# Patient Record
Sex: Male | Born: 1990 | Race: White | Hispanic: No | Marital: Married | Smoking: Never smoker
Health system: Southern US, Community
[De-identification: ages and names within clinical notes are randomized; demographics above are authoritative.]

## PROBLEM LIST (undated history)

## (undated) DIAGNOSIS — I10 Essential (primary) hypertension: Secondary | ICD-10-CM

## (undated) DIAGNOSIS — R569 Unspecified convulsions: Secondary | ICD-10-CM

## (undated) HISTORY — PX: APPENDECTOMY: SHX54

---
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
## 2010-10-12 ENCOUNTER — Inpatient Hospital Stay (INDEPENDENT_AMBULATORY_CARE_PROVIDER_SITE_OTHER)
Admission: RE | Admit: 2010-10-12 | Discharge: 2010-10-12 | Disposition: A | Discharge status: Home / Self Care | Payer: Self-pay | Source: Ambulatory Visit | Attending: Emergency Medicine | Admitting: Emergency Medicine

## 2010-10-12 DIAGNOSIS — J069 Acute upper respiratory infection, unspecified: Secondary | ICD-10-CM

## 2011-01-16 ENCOUNTER — Encounter: Payer: Self-pay | Admitting: *Deleted

## 2011-01-16 ENCOUNTER — Emergency Department (HOSPITAL_COMMUNITY)
Admission: EM | Admit: 2011-01-16 | Discharge: 2011-01-16 | Disposition: A | Discharge status: Home / Self Care | Payer: Self-pay | Attending: Emergency Medicine | Admitting: Emergency Medicine

## 2011-01-16 DIAGNOSIS — H109 Unspecified conjunctivitis: Secondary | ICD-10-CM | POA: Insufficient documentation

## 2011-01-16 MED ORDER — SULFACETAMIDE SODIUM 10 % OP SOLN
2.0000 [drp] | Freq: Once | OPHTHALMIC | Status: DC
  Filled 2011-01-16: qty 15

## 2011-01-16 NOTE — ED Provider Notes (Signed)
History     Chief Complaint  Patient presents with  . Conjunctivitis   Patient is a 20 y.o. male presenting with conjunctivitis. The history is provided by the patient.  Conjunctivitis  The current episode started 2 days ago. The onset was gradual. The problem has been gradually worsening. The problem is mild. The symptoms are relieved by nothing. The symptoms are aggravated by nothing. Associated symptoms include eye discharge, eye pain and eye redness. Pertinent negatives include no fever, no decreased vision, no photophobia, no ear discharge, no headaches, no rhinorrhea, no sore throat, no cough, no URI and no rash. The eye pain is mild. There is pain in the right eye. The eyelid exhibits redness. He has been behaving normally.    History reviewed. No pertinent past medical history.  Past Surgical History  Procedure Date  . Appendectomy     Family History  Problem Relation Age of Onset  . Hypertension Mother   . Heart failure Mother   . Hypertension Father   . Heart failure Father     History  Substance Use Topics  . Smoking status: Never Smoker   . Smokeless tobacco: Not on file  . Alcohol Use: No      Review of Systems  Constitutional: Negative for fever.  HENT: Negative for sore throat, rhinorrhea and ear discharge.   Eyes: Positive for pain, discharge and redness. Negative for photophobia.  Respiratory: Negative for cough.   Skin: Negative for rash.  Neurological: Negative for headaches.  All other systems reviewed and are negative.    Physical Exam  BP 126/64  Pulse 84  Temp(Src) 98.2 F (36.8 C) (Oral)  Resp 16  Ht 5\' 9"  (1.753 m)  Wt 180 lb (81.647 kg)  BMI 26.58 kg/m2  SpO2 99%  Physical Exam  Vitals reviewed. Constitutional: He is oriented to person, place, and time. He appears well-developed and well-nourished.  HENT:  Head: Normocephalic and atraumatic.  Eyes: Pupils are equal, round, and reactive to light. Right eye exhibits discharge.  Right conjunctiva is injected. Right eye exhibits normal extraocular motion and no nystagmus.  Neck: Normal range of motion.  Cardiovascular: Normal rate, regular rhythm, normal heart sounds and intact distal pulses.   Pulmonary/Chest: Effort normal and breath sounds normal. He has no wheezes.  Abdominal: Soft. Bowel sounds are normal. There is no tenderness.  Musculoskeletal: Normal range of motion.  Neurological: He is alert and oriented to person, place, and time.  Skin: Skin is warm and dry.  Psychiatric: He has a normal mood and affect.    ED Course  Procedures  MDM   Bleph 10 given in ed.      Candis Musa, PA 01/16/11 1717

## 2011-01-16 NOTE — ED Notes (Signed)
Rt eye red and watering x 4 days. States that it is draining at night.

## 2011-02-16 NOTE — ED Provider Notes (Signed)
History     CSN: [id number] Arrival date & time: 01/16/2011  3:43 PM  Chief Complaint  Patient presents with  . Conjunctivitis   HPI  History reviewed. No pertinent past medical history.  Past Surgical History  Procedure Date  . Appendectomy     Family History  Problem Relation Age of Onset  . Hypertension Mother   . Heart failure Mother   . Hypertension Father   . Heart failure Father     History  Substance Use Topics  . Smoking status: Never Smoker   . Smokeless tobacco: Not on file  . Alcohol Use: No      Review of Systems  Physical Exam  BP 126/64  Pulse 84  Temp(Src) 98.2 F (36.8 C) (Oral)  Resp 16  Ht 5\' 9"  (1.753 m)  Wt 180 lb (81.647 kg)  BMI 26.58 kg/m2  SpO2 99%  Physical Exam  ED Course  Procedures  MDM Medical screening examination/treatment/procedure(s) were performed by non-physician practitioner and as supervising physician I was immediately available for consultation/collaboration.      Donnetta Hutching, MD 02/16/11 1327

## 2011-10-01 ENCOUNTER — Emergency Department (HOSPITAL_COMMUNITY)
Admission: EM | Admit: 2011-10-01 | Discharge: 2011-10-01 | Disposition: A | Discharge status: Home / Self Care | Payer: Self-pay | Attending: Emergency Medicine | Admitting: Emergency Medicine

## 2011-10-01 ENCOUNTER — Encounter (HOSPITAL_COMMUNITY): Payer: Self-pay | Admitting: *Deleted

## 2011-10-01 DIAGNOSIS — N4281 Prostatodynia syndrome: Secondary | ICD-10-CM

## 2011-10-01 DIAGNOSIS — N509 Disorder of male genital organs, unspecified: Secondary | ICD-10-CM | POA: Insufficient documentation

## 2011-10-01 DIAGNOSIS — R109 Unspecified abdominal pain: Secondary | ICD-10-CM | POA: Insufficient documentation

## 2011-10-01 DIAGNOSIS — N419 Inflammatory disease of prostate, unspecified: Secondary | ICD-10-CM | POA: Insufficient documentation

## 2011-10-01 DIAGNOSIS — N4289 Other specified disorders of prostate: Secondary | ICD-10-CM | POA: Insufficient documentation

## 2011-10-01 LAB — URINALYSIS, ROUTINE W REFLEX MICROSCOPIC
Bilirubin Urine: NEGATIVE
Glucose, UA: NEGATIVE mg/dL
Ketones, ur: NEGATIVE mg/dL
Leukocytes, UA: NEGATIVE
Nitrite: NEGATIVE
Protein, ur: NEGATIVE mg/dL
Urobilinogen, UA: 0.2 mg/dL (ref 0.0–1.0)
pH: 6 (ref 5.0–8.0)

## 2011-10-01 LAB — URINE MICROSCOPIC-ADD ON

## 2011-10-01 MED ORDER — MINOCYCLINE HCL 100 MG PO CAPS: 100.0000 mg | ORAL_CAPSULE | Freq: Two times a day (BID) | ORAL | Status: AC

## 2011-10-01 MED ORDER — CEFTRIAXONE SODIUM 250 MG IJ SOLR
250.0000 mg | Freq: Once | INTRAMUSCULAR | Status: AC
  Administered 2011-10-01: 250 mg via INTRAMUSCULAR
  Filled 2011-10-01: qty 250

## 2011-10-01 MED ORDER — DOXYCYCLINE HYCLATE 100 MG PO TABS
100.0000 mg | ORAL_TABLET | Freq: Once | ORAL | Status: AC
  Administered 2011-10-01: 100 mg via ORAL
  Filled 2011-10-01: qty 1

## 2011-10-01 MED ORDER — HYDROCODONE-ACETAMINOPHEN 5-325 MG PO TABS: ORAL_TABLET | ORAL | Status: AC

## 2011-10-01 NOTE — Discharge Instructions (Signed)
RESOURCE GUIDE  Dental Problems  Patients with Medicaid: [facility]                     [facility] [address]                                           [address] Phone:  [phone]                                                  Phone:  [phone]  If unable to pay or uninsured, contact:  [facility] or [facility] to become qualified for the adult dental clinic.  Chronic Pain Problems Contact [facility]  [phone] Patients need to be referred by their primary care doctor.  Insufficient Money for Medicine Contact [employer]:  call "211" or [facility] 271-5999.  No Primary Care Doctor Call [facility]  [phone] Other agencies that provide inexpensive medical care    [facility]  [phone]    [facility]  [phone]    [facility]  [phone]    [facility] Clinic  [phone]    [facility]  [phone]    [facility]  [phone]  Psychological Services [facility]  [phone] [facility]  [phone] [facility]   [phone] (emergency services [phone])  Substance Abuse Resources [facility]  336-882-2125 [facility] 336-784-9470 [facility] 336-285-9073 [facility] 336-845-3988 Residential & Outpatient Substance Abuse Program  800-659-3381  Abuse/Neglect [place] Child Abuse Hotline (336) 641-3795 [place] Child Abuse Hotline 800-378-5315 (After Hours)  Emergency Shelter [facility] (336) 271-5985  Maternity Homes [facility] (336) 275-9566 [facility] (704) 372-4663  MRSA Hotline #:   832-7006    [place] [facility]  [facility] [place] [facility]     [employer]                          [facility] [address] [place]                       [address]      [address]  [place]                                                [place]                            [place] Phone:  [phone]                                   Phone:  [phone]                 Phone:  [phone]  [facility] Phone:  [phone]    [facility] Child Abuse Hotline 201 149 6450 (302) 679-8930 (After Hours)    Take the prescriptions as directed.  Your urine, gonorrhea and chlamydia cultures are pending results, and you will receive a phone call in the next several days if they are positive for infection.  Call the Urologist today to schedule a follow up appointment within the next 3 days.  Return to the Emergency Department immediately sooner if worsening.

## 2011-10-01 NOTE — ED Notes (Signed)
Pt c/o pain between scrotum and rectum x 1 day

## 2011-10-01 NOTE — ED Notes (Signed)
Pt reporting pain in perineum area. States that it feels swollen, but no swelling or redness noted.  Denies injury to area.

## 2011-10-01 NOTE — ED Provider Notes (Signed)
History     CSN: [id number]  Arrival date & time 10/01/11  0126   First MD Initiated Contact with Patient 10/01/11 [phone]      Chief Complaint  Patient presents with  . Groin Pain     HPI Pt was seen at 0235.   Per pt, c/o gradual onset and persistence of constant pain in his perineal area since yesterday.  States it "hurts to sit on it."  Denies injury, no abd pain, no rectal pain, no N/V/D, no testicular pain/swelling, no penile drainage, no dysuria/hematuria, no fevers, no rash.    History reviewed. No pertinent past medical history.  Past Surgical History  Procedure Date  . Appendectomy     Family History  Problem Relation Age of Onset  . Hypertension Mother   . Heart failure Mother   . Hypertension Father   . Heart failure Father     History  Substance Use Topics  . Smoking status: Never Smoker   . Smokeless tobacco: Not on file  . Alcohol Use: No    Review of Systems ROS: Statement: All systems negative except as marked or noted in the HPI; Constitutional: Negative for fever and chills. ; ; Eyes: Negative for eye pain, redness and discharge. ; ; ENMT: Negative for ear pain, hoarseness, nasal congestion, sinus pressure and sore throat. ; ; Cardiovascular: Negative for chest pain, palpitations, diaphoresis, dyspnea and peripheral edema. ; ; Respiratory: Negative for cough, wheezing and stridor. ; ; Gastrointestinal: Negative for nausea, vomiting, diarrhea, abdominal pain, blood in stool, hematemesis, jaundice and rectal bleeding. . ; ; Genitourinary: +perineal area pain.  Negative for dysuria, flank pain and hematuria. ; Genital:  No penile drainage or rash, no testicular pain or swelling, no scrotal rash or swelling.; ; Musculoskeletal: Negative for back pain and neck pain. Negative for swelling and trauma.; ; Skin: Negative for pruritus, rash, abrasions, blisters, bruising and skin lesion.; ; Neuro: Negative for headache, lightheadedness and neck stiffness. Negative for  weakness, altered level of consciousness , altered mental status, extremity weakness, paresthesias, involuntary movement, seizure and syncope.     Allergies  Review of patient's allergies indicates no known allergies.  Home Medications  No current outpatient prescriptions on file.  BP 157/87  Pulse 107  Temp 97.9 F (36.6 C)  Resp 20  Ht 5\' 8"  (1.727 m)  Wt 200 lb (90.719 kg)  BMI 30.41 kg/m2  SpO2 99%  Physical Exam 0240: Physical examination:  Nursing notes reviewed; Vital signs and O2 SAT reviewed;  Constitutional: Well developed, Well nourished, Well hydrated, In no acute distress; Head:  Normocephalic, atraumatic; Eyes: EOMI, PERRL, No scleral icterus; ENMT: Mouth and pharynx normal, Mucous membranes moist; Neck: Supple, Full range of motion, No lymphadenopathy; Cardiovascular: Regular rate and rhythm, No murmur, rub, or gallop; Respiratory: Breath sounds clear & equal bilaterally, No rales, rhonchi, wheezes, or rub, Normal respiratory effort/excursion; Chest: Nontender, Movement normal; Abdomen: Soft, Nontender, Nondistended, Normal bowel sounds; Genitourinary: No CVA tenderness; Genital and rectal exam performed with pt permission and male ED Tech chaparone present during exam.  No perineal erythema or ecchymosis, no open wounds, no soft tissue crepitus.  No penile lesions or drainage.  No scrotal erythema, edema or tenderness to palp.  Normal testicular lie.  No testicular tenderness to palp.  +cremasteric reflexes bilat.  No inguinal LAN or palpable masses.  +prostate is tender on rectal exam.  Anal tone normal.  Non-tender, soft brown stool in rectal vault.  No fissures, no external  hemorrhoids, no palp masses, no drainage.;  Extremities: Pulses normal, No tenderness, No edema, No calf edema or asymmetry.; Neuro: AA&Ox3, Major CN grossly intact. Gait is upright and steady. No gross focal motor or sensory deficits in extremities.; Skin: Color normal, Warm, Dry, no rash.   ED Course    Procedures   MDM  MDM Reviewed: nursing note and vitals Interpretation: labs   Results for orders placed during the hospital encounter of 10/01/11  URINALYSIS, ROUTINE W REFLEX MICROSCOPIC      Component Value Range   Color, Urine YELLOW  YELLOW    APPearance CLEAR  CLEAR    Specific Gravity, Urine >1.030 (*) 1.005 - 1.030    pH 6.0  5.0 - 8.0    Glucose, UA NEGATIVE  NEGATIVE (mg/dL)   Hgb urine dipstick TRACE (*) NEGATIVE    Bilirubin Urine NEGATIVE  NEGATIVE    Ketones, ur NEGATIVE  NEGATIVE (mg/dL)   Protein, ur NEGATIVE  NEGATIVE (mg/dL)   Urobilinogen, UA 0.2  0.0 - 1.0 (mg/dL)   Nitrite NEGATIVE  NEGATIVE    Leukocytes, UA NEGATIVE  NEGATIVE   URINE MICROSCOPIC-ADD ON      Component Value Range   Squamous Epithelial / LPF RARE  RARE    WBC, UA 0-2  <3 (WBC/hpf)   RBC / HPF 0-2  <3 (RBC/hpf)   Bacteria, UA RARE  RARE      3:53 AM:  Prostate tender on exam.  Endorses hx of STD, though denies current symptoms.  Perineal area is not erythematous, no soft tissue crepitus, no ecchymosis; doubt cellulitis, abscess or Fournier's at this time.  Pt denies injury or overuse (ie: bicycling) to area.  Rectal exam itself is NT, no drainage, no masses are palpable. Testicles and scrotum are NT, without erythema/ecchymosis/edema.  Pt's VSS, afebrile, not toxic appearing.  Wants to go home now.  UC is pending.  GC/chlam is pending.  Will tx with rocephin and doxycycline/minocycline.  Dx testing d/w pt.  Questions answered.  Verb understanding, agreeable to d/c home with outpt f/u with Uro MD this week.              Laray Anger, DO 10/01/11 1805

## 2011-10-02 LAB — GC/CHLAMYDIA PROBE AMP, GENITAL: Chlamydia, DNA Probe: NEGATIVE

## 2011-10-02 LAB — URINE CULTURE: Culture  Setup Time: 201304011356

## 2011-10-03 ENCOUNTER — Encounter (HOSPITAL_COMMUNITY): Payer: Self-pay | Admitting: *Deleted

## 2011-10-03 ENCOUNTER — Emergency Department (HOSPITAL_COMMUNITY)
Admission: EM | Admit: 2011-10-03 | Discharge: 2011-10-04 | Disposition: A | Discharge status: Home / Self Care | Payer: Self-pay | Attending: Emergency Medicine | Admitting: Emergency Medicine

## 2011-10-03 DIAGNOSIS — IMO0001 Reserved for inherently not codable concepts without codable children: Secondary | ICD-10-CM | POA: Insufficient documentation

## 2011-10-03 DIAGNOSIS — R110 Nausea: Secondary | ICD-10-CM | POA: Insufficient documentation

## 2011-10-03 DIAGNOSIS — R109 Unspecified abdominal pain: Secondary | ICD-10-CM | POA: Insufficient documentation

## 2011-10-03 DIAGNOSIS — L03319 Cellulitis of trunk, unspecified: Secondary | ICD-10-CM | POA: Insufficient documentation

## 2011-10-03 DIAGNOSIS — R51 Headache: Secondary | ICD-10-CM | POA: Insufficient documentation

## 2011-10-03 DIAGNOSIS — L02215 Cutaneous abscess of perineum: Secondary | ICD-10-CM

## 2011-10-03 DIAGNOSIS — L02219 Cutaneous abscess of trunk, unspecified: Secondary | ICD-10-CM | POA: Insufficient documentation

## 2011-10-03 DIAGNOSIS — R509 Fever, unspecified: Secondary | ICD-10-CM | POA: Insufficient documentation

## 2011-10-03 LAB — URINALYSIS, ROUTINE W REFLEX MICROSCOPIC
Bilirubin Urine: NEGATIVE
Glucose, UA: NEGATIVE mg/dL
Hgb urine dipstick: NEGATIVE
Leukocytes, UA: NEGATIVE
Nitrite: NEGATIVE
Urobilinogen, UA: 0.2 mg/dL (ref 0.0–1.0)
pH: 5.5 (ref 5.0–8.0)

## 2011-10-03 MED ORDER — OXYCODONE-ACETAMINOPHEN 5-325 MG PO TABS: 1.0000 | ORAL_TABLET | Freq: Four times a day (QID) | ORAL | Status: AC | PRN

## 2011-10-03 MED ORDER — SULFAMETHOXAZOLE-TMP DS 800-160 MG PO TABS
1.0000 | ORAL_TABLET | Freq: Once | ORAL | Status: AC
  Administered 2011-10-03: 1 via ORAL
  Filled 2011-10-03: qty 1

## 2011-10-03 MED ORDER — OXYCODONE-ACETAMINOPHEN 5-325 MG PO TABS
2.0000 | ORAL_TABLET | Freq: Once | ORAL | Status: AC
  Administered 2011-10-03: 2 via ORAL
  Filled 2011-10-03: qty 2

## 2011-10-03 MED ORDER — CEFIXIME 400 MG PO TABS
400.0000 mg | ORAL_TABLET | Freq: Once | ORAL | Status: AC
  Administered 2011-10-03: 400 mg via ORAL
  Filled 2011-10-03: qty 1

## 2011-10-03 MED ORDER — PROMETHAZINE HCL 12.5 MG PO TABS
12.5000 mg | ORAL_TABLET | Freq: Once | ORAL | Status: AC
  Administered 2011-10-03: 12.5 mg via ORAL
  Filled 2011-10-03: qty 1

## 2011-10-03 MED ORDER — IBUPROFEN 800 MG PO TABS
800.0000 mg | ORAL_TABLET | Freq: Once | ORAL | Status: AC
  Administered 2011-10-03: 800 mg via ORAL
  Filled 2011-10-03: qty 1

## 2011-10-03 MED ORDER — IBUPROFEN 800 MG PO TABS
ORAL_TABLET | ORAL | Status: AC
  Administered 2011-10-03: 800 mg
  Filled 2011-10-03: qty 1

## 2011-10-03 NOTE — ED Provider Notes (Signed)
History     CSN: [id number]  Arrival date & time 10/03/11  2053   First MD Initiated Contact with Patient 10/03/11 2155      Chief Complaint  Patient presents with  . Groin Pain    (Consider location/radiation/quality/duration/timing/severity/associated sxs/prior treatment) Patient is a 21 y.o. male presenting with groin pain. The history is provided by the patient and the spouse.  Groin Pain This is a new problem. The current episode started in the past 7 days. The problem has been gradually worsening. Associated symptoms include a fever, headaches, myalgias and nausea. Pertinent negatives include no abdominal pain, arthralgias, chest pain, coughing or neck pain. The symptoms are aggravated by walking. He has tried NSAIDs and oral narcotics for the symptoms.    History reviewed. No pertinent past medical history.  Past Surgical History  Procedure Date  . Appendectomy     Family History  Problem Relation Age of Onset  . Hypertension Mother   . Heart failure Mother   . Hypertension Father   . Heart failure Father     History  Substance Use Topics  . Smoking status: Never Smoker   . Smokeless tobacco: Not on file  . Alcohol Use: No      Review of Systems  Constitutional: Positive for fever. Negative for activity change.       All ROS Neg except as noted in HPI  HENT: Negative for nosebleeds and neck pain.   Eyes: Negative for photophobia and discharge.  Respiratory: Negative for cough, shortness of breath and wheezing.   Cardiovascular: Negative for chest pain and palpitations.  Gastrointestinal: Positive for nausea. Negative for abdominal pain and blood in stool.  Genitourinary: Negative for dysuria, frequency and hematuria.  Musculoskeletal: Positive for myalgias. Negative for back pain and arthralgias.  Skin: Negative.   Neurological: Positive for headaches. Negative for dizziness, seizures and speech difficulty.  Psychiatric/Behavioral: Negative for  hallucinations and confusion.    Allergies  Review of patient's allergies indicates no known allergies.  Home Medications   Current Outpatient Rx  Name Route Sig Dispense Refill  . HYDROCODONE-ACETAMINOPHEN 5-325 MG PO TABS  1 or 2 tabs PO q4-6 hours prn pain 20 tablet 0  . IBUPROFEN 200 MG PO TABS Oral Take 600 mg by mouth as needed. For pain    . MINOCYCLINE HCL 100 MG PO CAPS Oral Take 1 capsule (100 mg total) by mouth 2 (two) times daily. 28 capsule 0  . OXYCODONE-ACETAMINOPHEN 5-325 MG PO TABS Oral Take 1 tablet by mouth every 6 (six) hours as needed for pain. 6 tablet 0    BP 151/82  Pulse 126  Temp(Src) 101.1 F (38.4 C) (Oral)  Resp 20  Ht 5\' 8"  (1.727 m)  Wt 200 lb (90.719 kg)  BMI 30.41 kg/m2  SpO2 99%  Physical Exam  Nursing note and vitals reviewed. Constitutional: He is oriented to person, place, and time. He appears well-developed and well-nourished.  Non-toxic appearance.  HENT:  Head: Normocephalic.  Right Ear: Tympanic membrane and external ear normal.  Left Ear: Tympanic membrane and external ear normal.  Eyes: EOM and lids are normal. Pupils are equal, round, and reactive to light.  Neck: Normal range of motion. Neck supple. Carotid bruit is not present.  Cardiovascular: Normal rate, regular rhythm, normal heart sounds, intact distal pulses and normal pulses.   Pulmonary/Chest: Breath sounds normal. No respiratory distress.  Abdominal: Soft. Bowel sounds are normal. There is no tenderness. There is no guarding.  Genitourinary:  Rt perineal abscess with increase redness. No testicular or scrotal pain or involvement. Few palpable inguinal nodes. No pain or tenderness of the penis. Abscess does not involve the anus.  Musculoskeletal: Normal range of motion.  Lymphadenopathy:       Head (right side): No submandibular adenopathy present.       Head (left side): No submandibular adenopathy present.    He has no cervical adenopathy.  Neurological: He is  alert and oriented to person, place, and time. He has normal strength. No cranial nerve deficit or sensory deficit.  Skin: Skin is warm and dry.  Psychiatric: He has a normal mood and affect. His speech is normal.    ED Course  Procedures :I AND D OF PERINEAL ABSCESS - The perineal abscess was cleansed with chlorhexidine. The patient was then draped in the usual sterile fashion. The wound was infiltrated with 0.5% Sensorcaine. After local anesthesia achieved, the wound was incised with an 11 blade. There was moderate puslike material removed. The wound was then irrigated with some normal saline. Following this the wound was packed with 1/4 inch iodoform gauze. Sterile dressing was applied by me. Patient tolerated the procedure without complication or problem. Culture sent to the Lab.  Labs Reviewed  URINALYSIS, ROUTINE W REFLEX MICROSCOPIC - Abnormal; Notable for the following:    Specific Gravity, Urine <1.005 (*)    All other components within normal limits  CULTURE, ROUTINE-ABSCESS   No results found.   1. Perineal abscess       MDM  I have reviewed nursing notes, vital signs, and all appropriate lab and imaging results for this patient. Patient noted to have an abscess of the right perineal area. Incision and drainage carried out without problem. Culture sent to lab. Urinalysis was normal tonight. Patient treated with Percocet 5 mg in a  to go pack.. patient to finish his minocycline. He is to have the packing removed on April 6. He is invited to return to the emergency department sooner if any changes, problems, or concerns.       Kathie Dike, [place] 10/04/11 Salley Hews

## 2011-10-03 NOTE — Discharge Instructions (Signed)
Please return April 6 for packing to be removed. Use Percocet every 6 hours tonight and tomorrow for pain if needed. 3 ibuprofen every 6 hour with food for fever.  Please finish the minocycline as suggested. After you finish the percocet, you may use the hydrocodone for pain. Please keep dressing clean and dry.Abscess An abscess (boil or furuncle) is an infected area under your skin. This area is filled with yellowish white fluid (pus). HOME CARE   Only take medicine as told by your doctor.   Keep the skin clean around your abscess. Keep clothes that may touch the abscess clean.   Change any bandages (dressings) as told by your doctor.   Avoid direct skin contact with other people. The infection can spread by skin contact with others.   Practice good hygiene and do not share personal care items.   Do not share athletic equipment, towels, or whirlpools. Shower after every practice or work out session.   If a draining area cannot be covered:   Do not play sports.   Children should not go to daycare until the wound has healed or until fluid (drainage) stops coming out of the wound.   See your doctor for a follow-up visit as told.  GET HELP RIGHT AWAY IF:   There is more pain, puffiness (swelling), and redness in the wound site.   There is fluid or bleeding from the wound site.   You have muscle aches, chills, fever, or feel sick.   You or your child has a temperature by mouth above 102 F (38.9 C), not controlled by medicine.   Your baby is older than 3 months with a rectal temperature of 102 F (38.9 C) or higher.  MAKE SURE YOU:   Understand these instructions.   Will watch your condition.   Will get help right away if you are not doing well or get worse.  Document Released: 12/05/2007 Document Revised: 06/07/2011 Document Reviewed: 12/05/2007 [facility] Patient Information 2012 [place], [place].

## 2011-10-03 NOTE — ED Notes (Signed)
Infection groin, seen here for same and started antibiotic.  Here because cont fever.

## 2011-10-05 NOTE — ED Provider Notes (Signed)
Medical screening examination/treatment/procedure(s) were performed by non-physician practitioner and as supervising physician I was immediately available for consultation/collaboration.   Laray Anger, DO 10/05/11 1709

## 2011-10-06 ENCOUNTER — Emergency Department (HOSPITAL_COMMUNITY)
Admission: EM | Admit: 2011-10-06 | Discharge: 2011-10-06 | Disposition: A | Discharge status: Home / Self Care | Payer: Self-pay | Attending: Emergency Medicine | Admitting: Emergency Medicine

## 2011-10-06 ENCOUNTER — Encounter (HOSPITAL_COMMUNITY): Payer: Self-pay | Admitting: Emergency Medicine

## 2011-10-06 DIAGNOSIS — Z4801 Encounter for change or removal of surgical wound dressing: Secondary | ICD-10-CM | POA: Insufficient documentation

## 2011-10-06 DIAGNOSIS — L0291 Cutaneous abscess, unspecified: Secondary | ICD-10-CM

## 2011-10-06 NOTE — ED Provider Notes (Signed)
History  This chart was scribed for Tim Gaskins, MD by Tim Tucker. This patient was seen in room APA09/APA09 and the patient's care was started at 12:38PM.  CSN: [id number]  Arrival date & time 10/06/11  1214   First MD Initiated Contact with Patient 10/06/11 1223      Chief Complaint  Patient presents with  . Wound Check    Patient is a 21 y.o. male presenting with wound check. The history is provided by the patient. No language interpreter was used.  Wound Check  He was treated in the ED 2 to 3 days ago. Previous treatment in the ED includes I&D of abscess. Treatments since wound repair include oral antibiotics. Fever duration: no fever. There has been no drainage from the wound. The redness has improved. The swelling has improved. The pain has improved. He has no difficulty moving the affected extremity or digit.    Tim Tucker is a 21 y.o. male who presents to the Emergency Department for a wound recheck. Pt was seen here on 10/03/11 and had an abscess incised and drained on the right buttock. The wound was packed and he was prescribed antibiotics and pain medications. He denies having any fevers or emesis since the incision and drainage. His fever before the drainage was measured at 104. He denies having any drainage. He states that his pain and the swelling around the area have improved. He denies having any other injuries or illnesses at this time. He has no h/o chronic medical conditions. He denies smoking and alcohol use.  PMH - none  Past Surgical History  Procedure Date  . Appendectomy     Family History  Problem Relation Age of Onset  . Hypertension Mother   . Heart failure Mother   . Hypertension Father   . Heart failure Father     History  Substance Use Topics  . Smoking status: Never Smoker   . Smokeless tobacco: Not on file  . Alcohol Use: No      Review of Systems  Constitutional: Negative for fever.  Respiratory: Negative for cough.     Cardiovascular: Negative for chest pain.  Gastrointestinal: Negative for vomiting.  Skin: Positive for wound (healing I&D abscess on right buttock).    Allergies  Review of patient's allergies indicates no known allergies.  Home Medications   Current Outpatient Rx  Name Route Sig Dispense Refill  . HYDROCODONE-ACETAMINOPHEN 5-325 MG PO TABS  1 or 2 tabs PO q4-6 hours prn pain 20 tablet 0  . IBUPROFEN 200 MG PO TABS Oral Take 600 mg by mouth as needed. For pain    . MINOCYCLINE HCL 100 MG PO CAPS Oral Take 1 capsule (100 mg total) by mouth 2 (two) times daily. 28 capsule 0  . OXYCODONE-ACETAMINOPHEN 5-325 MG PO TABS Oral Take 1 tablet by mouth every 6 (six) hours as needed for pain. 6 tablet 0    Triage Vitals: BP 154/83  Pulse 97  Temp(Src) 97.9 F (36.6 C) (Oral)  Resp 20  Ht 5\' 8"  (1.727 m)  Wt 200 lb (90.719 kg)  BMI 30.41 kg/m2  SpO2 100%  Physical Exam  Nursing note and vitals reviewed. CONSTITUTIONAL: Well developed/well nourished HEAD AND FACE: Normocephalic/atraumatic EYES: EOMI ENMT: Mucous membranes moist NECK: supple no meningeal signs CV: S1/S2 noted, no murmurs/rubs/gallops noted LUNGS: Lungs are clear to auscultation bilaterally, no apparent distress ABDOMEN: soft, nontender, no rebound or guarding Chaperone present Well healing abscess to right buttock/perineum.  No drainage/bleeding/erythema/tenderness  No scrotal tenderness/erythema/edema No crepitance noted NEURO: Pt is awake/alert, moves all extremitiesx4 EXTREMITIES: pulses normal, full ROM SKIN: warm, color normal PSYCH: no abnormalities of mood noted  ED Course  Procedures   DIAGNOSTIC STUDIES: Oxygen Saturation is 100% on room air, normal by my interpretation.    COORDINATION OF CARE: 12:39PM-Advised pt to keep the area bandaged and to clean it. Advised pt to return if the area becomes more painful.   Denies fever/pain with defecation Does not appear clinically to be perirectal  abscess   MDM  Nursing notes reviewed and considered in documentation Previous records reviewed and considered    I personally performed the services described in this documentation, which was scribed in my presence. The recorded information has been reviewed and considered.         Tim Gaskins, MD 10/06/11 [phone]

## 2011-10-06 NOTE — ED Notes (Signed)
Pt presents for a wound recheck on Rt buttocks cheek. Pt's wound is open with no drainage noted. Wound redressed.

## 2011-10-06 NOTE — Discharge Instructions (Signed)

## 2011-10-06 NOTE — ED Notes (Signed)
Pt here to have packing removed 

## 2011-10-07 LAB — CULTURE, ROUTINE-ABSCESS
Culture: NO GROWTH
Gram Stain: NONE SEEN

## 2012-07-07 ENCOUNTER — Ambulatory Visit
Admission: RE | Admit: 2012-07-07 | Discharge: 2012-07-07 | Disposition: A | Discharge status: Home / Self Care | Payer: Self-pay | Source: Ambulatory Visit | Attending: Physician Assistant | Admitting: Physician Assistant

## 2012-07-07 ENCOUNTER — Other Ambulatory Visit: Payer: Self-pay | Admitting: Physician Assistant

## 2012-07-07 DIAGNOSIS — Z021 Encounter for pre-employment examination: Secondary | ICD-10-CM

## 2012-12-04 ENCOUNTER — Encounter (HOSPITAL_COMMUNITY): Payer: Self-pay | Admitting: Emergency Medicine

## 2012-12-04 ENCOUNTER — Emergency Department (HOSPITAL_COMMUNITY)
Admission: EM | Admit: 2012-12-04 | Discharge: 2012-12-04 | Disposition: A | Discharge status: Home / Self Care | Payer: Self-pay | Attending: Emergency Medicine | Admitting: Emergency Medicine

## 2012-12-04 DIAGNOSIS — L02219 Cutaneous abscess of trunk, unspecified: Secondary | ICD-10-CM | POA: Insufficient documentation

## 2012-12-04 DIAGNOSIS — L02214 Cutaneous abscess of groin: Secondary | ICD-10-CM

## 2012-12-04 DIAGNOSIS — L03319 Cellulitis of trunk, unspecified: Secondary | ICD-10-CM | POA: Insufficient documentation

## 2012-12-04 MED ORDER — SULFAMETHOXAZOLE-TRIMETHOPRIM 800-160 MG PO TABS: 1.0000 | ORAL_TABLET | Freq: Two times a day (BID) | ORAL | Status: DC

## 2012-12-04 MED ORDER — CEPHALEXIN 500 MG PO CAPS: 1000.0000 mg | ORAL_CAPSULE | Freq: Two times a day (BID) | ORAL | Status: DC

## 2012-12-04 NOTE — ED Provider Notes (Signed)
History     CSN: [id number]  Arrival date & time 12/04/12  0031   First MD Initiated Contact with Patient 12/04/12 0114      Chief Complaint  Patient presents with  . Abscess    (Consider location/radiation/quality/duration/timing/severity/associated sxs/prior treatment) HPI History provided by pt.   Pt has had an abscess of right groin for the past 4-5 days.  Non-painful unless he is walking.  Drains purulent fluid when he squeezes it.  Has been soaking in hot water w/ espsom salts.  No associated fever and is otherwise feeling well.  Had had the one in same location that was I&D'd in the past.   History reviewed. No pertinent past medical history.  Past Surgical History  Procedure Laterality Date  . Appendectomy      Family History  Problem Relation Age of Onset  . Hypertension Mother   . Heart failure Mother   . Hypertension Father   . Heart failure Father     History  Substance Use Topics  . Smoking status: Never Smoker   . Smokeless tobacco: Not on file  . Alcohol Use: No      Review of Systems  All other systems reviewed and are negative.    Allergies  Review of patient's allergies indicates no known allergies.  Home Medications   Current Outpatient Rx  Name  Route  Sig  Dispense  Refill  . cephALEXin (KEFLEX) 500 MG capsule   Oral   Take 2 capsules (1,000 mg total) by mouth 2 (two) times daily.   28 capsule   0   . sulfamethoxazole-trimethoprim (SEPTRA DS) 800-160 MG per tablet   Oral   Take 1 tablet by mouth every 12 (twelve) hours.   14 tablet   0     BP 138/90  Pulse 66  Temp(Src) 98.3 F (36.8 C) (Oral)  Resp 16  Ht 5\' 9"  (1.753 m)  Wt 190 lb (86.183 kg)  BMI 28.05 kg/m2  SpO2 98%  Physical Exam  Nursing note and vitals reviewed. Constitutional: He is oriented to person, place, and time. He appears well-developed and well-nourished. No distress.  HENT:  Head: Normocephalic and atraumatic.  Eyes:  Normal appearance  Neck:  Normal range of motion.  Pulmonary/Chest: Effort normal.  Musculoskeletal: Normal range of motion.  Neurological: He is alert and oriented to person, place, and time.  Skin:  2cm erythematous abscess of right proximal medial thigh.  Draining purulent fluid at center.  No induration or surrounding cellulitis.  Minimally tender.    Psychiatric: He has a normal mood and affect. His behavior is normal.    ED Course  Procedures (including critical care time)  Labs Reviewed - No data to display No results found.   1. 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  I&D in similar location in past.  Draining purulent fluid, no surrounding cellulitis on exam.  Pt refuses I&D d/t time constraint and concern that he will not tolerate well.  He understands that drainage is the gold standard treatment but request trial of abx.  Prescribed bactrim/keflex and recommended that he continue warm soaks/compresses. Return precautions discussed.         Otilio Miu, PA-C 12/04/12 [phone]

## 2012-12-04 NOTE — ED Provider Notes (Signed)
Medical screening examination/treatment/procedure(s) were performed by non-physician practitioner and as supervising physician I was immediately available for consultation/collaboration.  Delara Shepheard, MD 12/04/12 0601 

## 2012-12-04 NOTE — ED Notes (Signed)
Pt states he has a boil between his legs on the right side   Pt states it started about 4 to 5 days ago   Pt states it hurts to walk

## 2014-03-11 ENCOUNTER — Encounter (HOSPITAL_COMMUNITY): Payer: Self-pay | Admitting: Emergency Medicine

## 2014-03-11 ENCOUNTER — Emergency Department (HOSPITAL_COMMUNITY)
Admission: EM | Admit: 2014-03-11 | Discharge: 2014-03-11 | Disposition: A | Discharge status: Home / Self Care | Attending: Emergency Medicine | Admitting: Emergency Medicine

## 2014-03-11 DIAGNOSIS — L237 Allergic contact dermatitis due to plants, except food: Secondary | ICD-10-CM

## 2014-03-11 DIAGNOSIS — L255 Unspecified contact dermatitis due to plants, except food: Secondary | ICD-10-CM | POA: Insufficient documentation

## 2014-03-11 MED ORDER — HYDROXYZINE PAMOATE 25 MG PO CAPS: 25.0000 mg | ORAL_CAPSULE | Freq: Three times a day (TID) | ORAL | Status: DC | PRN

## 2014-03-11 MED ORDER — PREDNISONE (PAK) 10 MG PO TABS: ORAL_TABLET | Freq: Every day | ORAL | Status: DC

## 2014-03-11 NOTE — ED Provider Notes (Signed)
CSN: [id number]     Arrival date & time 03/11/14  1816 History  This chart was scribed for non-physician practitioner, Tim Napoleon, NP working with Tim Maw Ward, DO, by Tim Tucker, ED Scribe. This patient was seen in room TR07C/TR07C and the patient's care was started at 7:39 PM.   Chief Complaint  Patient presents with  . Poison Ivy    Patient is a 23 y.o. male presenting with poison ivy. The history is provided by the patient. No language interpreter was used.  Poison Tim Tucker This is a new problem. The current episode started more than 2 days ago. The problem has been gradually worsening. Nothing aggravates the symptoms. Nothing relieves the symptoms. Treatments tried: Benadryl. The treatment provided no relief.    HPI Comments: Tim Tucker is a 23 y.o. male who presents to the Emergency Department for poison ivy to his neck, face, bilateral arms, and torso onset 3 days ago. Patient states he was in the woods and confirms contact with poison ivy. There is associated itching and redness. Patient has taken Benadryl without improvement of symptoms. He denies fever or chills.  History reviewed. No pertinent past medical history. Past Surgical History  Procedure Laterality Date  . Appendectomy     Family History  Problem Relation Age of Onset  . Hypertension Mother   . Heart failure Mother   . Hypertension Father   . Heart failure Father    History  Substance Use Topics  . Smoking status: Never Smoker   . Smokeless tobacco: Not on file  . Alcohol Use: No    Review of Systems  Constitutional: Negative for fever and chills.  Skin: Positive for rash.  all other systems negative    Allergies  Review of patient's allergies indicates no known allergies.  Home Medications   Prior to Admission medications   Not on File   Triage Vitals: BP 151/72  Pulse 67  Temp(Src) 97.4 F (36.3 C) (Oral)  Resp 18  Ht  (1.702 m)  Wt 183 lb (83.008 kg)  BMI 28.66 kg/m2  SpO2  99%  Physical Exam  Nursing note and vitals reviewed. Constitutional: He is oriented to person, place, and time. He appears well-developed and well-nourished.  HENT:  Rash to face   Eyes: Conjunctivae and EOM are normal. Pupils are equal, round, and reactive to light.  Neck: Neck supple.  Cardiovascular: Normal rate and regular rhythm.   Pulmonary/Chest: Effort normal and breath sounds normal.  Abdominal: Soft. There is no tenderness.  Musculoskeletal: Normal range of motion.  Neurological: He is alert and oriented to person, place, and time. No cranial nerve deficit.  Skin: Skin is warm and dry. Rash noted.  Vesicular lesions under both eyes, but nothing in the eyes. Left side of neck, on the bilateral forearms, upper arms, right side of chest, and left upper abdomen. Rash is consistent with poison oak.  Psychiatric: He has a normal mood and affect. His behavior is normal.    ED Course  Procedures (including critical care time)  DIAGNOSTIC STUDIES: Oxygen Saturation is 99% on room air, normal by my interpretation.    COORDINATION OF CARE: At 1945 Discussed treatment plan with patient which includes prednisone and hydroxyzine. Patient agrees. 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    MDM  23 y.o. male with rash to face, arms and torso s/p exposure to poison oak. Will treat for contact dermitis. Discussed with the patient and all questioned fully answered. He will return if any problems arise.    Medication List         hydrOXYzine 25 MG capsule  Commonly known as:  VISTARIL  Take 1 capsule (25 mg total) by mouth 3 (three) times daily as needed for itching.     predniSONE 10 MG tablet  Commonly known as:  STERAPRED UNI-PAK  Take by mouth daily. Take 6 tablets PO today followed by  5, 4, 3, 2, 1       I personally performed the services described in this documentation, which was scribed in my presence. The recorded information has been reviewed and is accurate.    Tim Tucker, [place] 03/12/14 1606

## 2014-03-11 NOTE — ED Notes (Signed)
Pt presents with a rash to arms and torso onset 3 days ago- pt admits to being in the woods.  Rash has spread neck and face today.  Pt taking Benadryl at home without relief.

## 2014-03-11 NOTE — Discharge Instructions (Signed)
Use calamine lotion to help dry the areas.

## 2014-03-15 NOTE — ED Provider Notes (Signed)
Medical screening examination/treatment/procedure(s) were performed by non-physician practitioner and as supervising physician I was immediately available for consultation/collaboration.   EKG Interpretation None        Kristen N Ward, DO 03/15/14 1011 

## 2014-04-13 ENCOUNTER — Emergency Department (INDEPENDENT_AMBULATORY_CARE_PROVIDER_SITE_OTHER)

## 2014-04-13 ENCOUNTER — Emergency Department (INDEPENDENT_AMBULATORY_CARE_PROVIDER_SITE_OTHER)
Admission: EM | Admit: 2014-04-13 | Discharge: 2014-04-13 | Disposition: A | Discharge status: Home / Self Care | Source: Home / Self Care | Attending: Family Medicine | Admitting: Family Medicine

## 2014-04-13 ENCOUNTER — Encounter (HOSPITAL_COMMUNITY): Payer: Self-pay | Admitting: Emergency Medicine

## 2014-04-13 DIAGNOSIS — M25562 Pain in left knee: Secondary | ICD-10-CM

## 2014-04-13 DIAGNOSIS — M5432 Sciatica, left side: Secondary | ICD-10-CM

## 2014-04-13 MED ORDER — DICLOFENAC SODIUM 1 % TD GEL: 2.0000 g | Freq: Four times a day (QID) | TRANSDERMAL | Status: DC

## 2014-04-13 MED ORDER — PREDNISONE 10 MG PO KIT: PACK | ORAL | Status: DC

## 2014-04-13 NOTE — ED Notes (Signed)
Reports numbness in left knee radiating down to toes.  Tingling sensation felt in toes.  Symptoms present x 4 wks.  Denies injury.

## 2014-04-13 NOTE — ED Provider Notes (Signed)
CSN: [id number]     Arrival date & time 04/13/14  1333 History   First MD Initiated Contact with Patient 04/13/14 1444     Chief Complaint  Patient presents with  . Joint Swelling    numbness in left knee   (Consider location/radiation/quality/duration/timing/severity/associated sxs/prior Treatment) HPI  L leg numbness and tingling. Started 4 wks ago. No trauma. From knee to end of toes. Comes and goes. 4-5 episodes per day. Happens at rest or w/ ambulation. Lasts 94mn-1hr. Tried moving leg or foot in different positions w/o benefit. 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    History reviewed. No pertinent past medical history. Past Surgical History  Procedure Laterality Date  . Appendectomy     Family History  Problem Relation Age of Onset  . Hypertension Mother   . Heart failure Mother   . Hypertension Father   . Heart failure Father    History  Substance Use Topics  . Smoking status: Never Smoker   . Smokeless tobacco: Not on file  . Alcohol Use: No    Review of Systems Per HPI with all other pertinent systems negative.   Allergies  Review of patient's allergies indicates no known allergies.  Home Medications   Prior to Admission medications   Medication Sig Start Date End Date Taking? Authorizing Provider  diclofenac sodium (VOLTAREN) 1 % GEL Apply 2-4 g topically 4 (four) times daily. 04/13/14   DWaldemar Dickens MD  hydrOXYzine (VISTARIL) 25 MG capsule Take 1 capsule (25 mg total) by mouth 3 (three) times daily as needed for itching. 03/11/14   Hope MBunnie Pion NP  predniSONE (STERAPRED UNI-PAK) 10 MG tablet Take by mouth daily. Take 6 tablets PO today followed by 5, 4, 3, 2, 1 03/11/14   Hope MBunnie Pion NP  PredniSONE 10 MG KIT 12 day dose pack 04/13/14   DWaldemar Dickens MD   BP 148/85  Pulse 71  Temp(Src) 98.3 F (36.8 C) (Oral)  Resp 12  SpO2 99% Physical Exam  Constitutional: He appears well-developed and  well-nourished.  HENT:  Head: Normocephalic and atraumatic.  Eyes: Pupils are equal, round, and reactive to light.  Neck: Normal range of motion.  Cardiovascular: Normal rate and normal heart sounds.   Pulmonary/Chest: Effort normal and breath sounds normal.  Abdominal: Soft. He exhibits no distension.  Musculoskeletal:  L knee FROM. No effusions. ttp along the medial joint line. Mild crepitus. lachman's negative. Valgus and Varrus stresses w/o pain.   Skin: Skin is warm and dry.  Psychiatric: He has a normal mood and affect. His behavior is normal. Judgment and thought content normal.    ED Course  Procedures (including critical care time) Labs Review Labs Reviewed - No data to display  Imaging Review Dg Knee Complete 4 Views Left  04/13/2014   CLINICAL DATA:  Numbness and tingling from the left knee to the toes  EXAM: LEFT KNEE - COMPLETE 4+ VIEW  COMPARISON:  None.  FINDINGS: There is no evidence of fracture, dislocation, or joint effusion. There is no evidence of arthropathy or other focal bone abnormality. Soft tissues are unremarkable.  IMPRESSION: Negative.   Electronically Signed   By: HKathreen Devoid  On: 04/13/2014 15:41     MDM   1. Sciatica, left   2. Knee pain, acute, left    Possible sciatica vs meniscal injury.  Previous steroids for poison ivy which also helped her rash Prednisone dose pack  Voltaren gel Exercises [employer] duty  F/u [employer] physician Precautions given and all questions answered   Linna Darner, MD Family Medicine 04/13/2014, 4:03 PM      Waldemar Dickens, MD 04/13/14 [phone]

## 2014-04-13 NOTE — Discharge Instructions (Signed)
The cause of your knee and leg pain and numbness is not clear. You may have irritated the sciatic nerve or injured the meniscal layer of the knee.  Please start the steroids and use the voltaren gel from time to time as needed Please start the exercises outlined below Let pain be your guide with regards to returning to physical activity  Lateral Collateral Knee Ligament Sprain with Phase I Rehab The lateral collateral ligament (LCL) of the knee helps hold the knee joint in proper alignment and prevents the bones from shifting out of alignment (displacing) toward the outside (laterally). Injury to the knee may cause a tear in the LCL ligament (sprain). The LCL is the least common ligament of the knee to be injured. Sprains may heal on their own, but they often result in a loose joint. Sprains are classified into three categories. Grade 1 sprains cause pain, but the tendon is not lengthened. Grade 2 sprains include a lengthened ligament, due to the ligament being stretched or partially ruptured. With grade 2 sprains there is still function, although the function may be decreased. Grade 3 sprains involve a complete tear of the tendon or muscle, and function is usually impaired. SYMPTOMS   Pain and tenderness on the outer side of the knee.  A "pop," tearing, or pulling sensation at the time of injury.  Bruising (contusion) at the site of injury within 48 hours of injury.  Knee stiffness.  Limping, often walking with the knee bent. CAUSES  An LCL sprain occurs when a force is placed on the ligament that is greater than it can handle. Common causes of injury include:  Direct hit (trauma) to the inner side of the knee, especially if the foot is planted on the ground.  Forceful pivoting of the body and leg while the foot is planted on the ground. RISK INCREASES WITH:  Contact sports (football, rugby).  Sports that require pivoting or cutting (soccer).  Poor knee strength and  flexibility.  Improper equipment use. PREVENTION   Warm up and stretch properly before activity.  Maintain physical fitness:  Strength, flexibility, and endurance.  Cardiovascular fitness.  Wear properly fitted protective equipment (correct length of cleats for surface).  Functional braces may be effective in preventing injury. PROGNOSIS  If treated properly, LCL tears usually heal on their own. Sometimes, surgery is required. RELATED COMPLICATIONS   Frequently recurring symptoms, such as knee giving way, instability, and swelling.  Injury to other structures in the knee joint.  Meniscal cartilage, resulting in locking and swelling of the knee.  Articular cartilage, resulting in knee arthritis.  Other ligaments of the knee (commonly).  Injury to nerves, causing numbness of the outer leg, foot, and ankle and weakness or paralysis, with inability to raise the ankle, big toe, or lesser toes.  Knee stiffness (loss of knee motion). TREATMENT  Treatment first involves the use of ice and medicine to reduce pain and inflammation. The use of strengthening and stretching exercises may help reduce pain with activity. These exercises may be performed at home, but referral to a therapist is often advised. You may be advised to walk with crutches until you are able to walk without a limp. Your caregiver may provide you with a hinged knee brace to help regain a full range of motion while also protecting the injured knee. For severe LCL injuries, or injuries that involve other ligaments of the knee, surgery is often advised. MEDICATION   If pain medicine is needed, nonsteroidal anti-inflammatory medicines (  aspirin and ibuprofen), or other minor pain relievers (acetaminophen), are often advised.  Do not take pain medicine for 7 days before surgery.  Prescription pain relievers may be given, if your caregiver thinks they are needed. Use only as directed and only as much as you need. HEAT AND  COLD  Cold treatment (icing) should be applied for 10 to 15 minutes every 2 to 3 hours for inflammation and pain, and immediately after activity that aggravates your symptoms. Use ice packs or an ice massage.  Heat treatment may be used before performing stretching and strengthening activities prescribed by your caregiver, physical therapist, or athletic trainer. Use a heat pack or a warm water soak. SEEK MEDICAL CARE IF:   Symptoms get worse or do not improve in 4 to 6 weeks, despite treatment.  New, unexplained symptoms develop. (Drugs used in treatment may produce side effects.) EXERCISES RANGE OF MOTION (ROM) AND STRETCHING EXERCISES - Lateral Collateral Knee Ligament Sprain Phase I These are some of the initial exercises that your physician, physical therapist or athletic trainer may have you perform to begin your rehabilitation. When you demonstrate gains in your flexibility and strength, your caregiver may progress you to Phase II exercises. As you perform these exercises, remember:   These initial exercises are intended to be gentle. They will help you restore motion without increasing any swelling.  Completing these exercises allows less painful movement and prepares you for the more aggressive strengthening exercises in Phase II.  An effective stretch should be held for at least 30 seconds.  A stretch should never be painful. You should only feel a gentle lengthening or release in the stretched tissue. RANGE OF MOTION - Knee Flexion, Active  Lie on your back with both knees straight. (If this causes back discomfort, bend your opposite knee, placing your foot flat on the floor.)  Slowly slide your heel back toward your buttocks until you feel a gentle stretch in the front of your knee or thigh.  Hold for __________ seconds. Slowly slide your heel back to the starting position. Repeat __________ times. Complete this exercise __________ times per day.  STRETCH - Knee Flexion,  Supine  Lie on the floor with your right / left heel and foot lightly touching the wall. (Place both feet on the wall, if you do not use a door frame.)  Without using any effort, allow gravity to slide your foot down the wall slowly until you feel a gentle stretch in the front of your right / left knee.  Hold this stretch for __________ seconds. Then return the leg to the starting position, using your healthy leg for help, if needed. Repeat __________ times. Complete this stretch __________ times per day.  RANGE OF MOTION - Knee Flexion and Extension, Active-Assisted  Sit on the edge of a table or chair with your thighs firmly supported. It may be helpful to place a folded towel under the end of your right / left thigh.  Flexion (bending): Place the ankle of your healthy leg on top of the other ankle. Use your healthy leg to gently bend your right / left knee until you feel a mild tension across the top of your knee.  Hold for __________ seconds.  Extension (straightening): Switch your ankles so your right / left leg is on top. Use your healthy leg to straighten your right / left knee until you feel a mild tension on the backside of your knee.  Hold for __________ seconds. Repeat __________ times.  Complete this exercise __________ times per day. STRETCH - Knee Extension Sitting  Sit with your right / left leg/heel propped on another chair, coffee table, or foot stool.  Allow your leg muscles to relax, letting gravity straighten out your knee.*  You should feel a stretch behind your right / left knee. Hold this position for __________ seconds. Repeat __________ times. Complete this stretch __________ times per day.  *Your physician, physical therapist, or athletic trainer may instruct you place a __________ weight on your thigh, just above your kneecap, to deepen the stretch.  STRENGTHENING EXERCISES Lateral Collateral Knee Ligament Sprain - Phase I These exercises may help you when  beginning to rehabilitate your injury. They may resolve your symptoms with or without further involvement from your physician, physical therapist, or athletic trainer. While completing these exercises, remember:   Muscles can gain both the endurance and the strength needed for everyday activities through controlled exercises.  Complete these exercises as instructed by your physician, physical therapist or athletic trainer. Increase the resistance and repetitions only as guided.  In order to return to more demanding activities, you will likely need to progress to more challenging exercises. Your physician, physical therapist or athletic trainer will advance your exercises when your tissues show adequate healing and your muscles demonstrate increased strength. STRENGTH - Quadriceps, Isometrics  Lie on your back with your right / left leg extended and your opposite knee bent.  Gradually tense the muscles in the front of your right / left thigh. You should see either your kneecap slide up toward your hip or increased dimpling just above the knee. This motion will push the back of the knee down toward the floor, mat, or bed on which you are lying.  Hold the muscle as tight as you can without increasing your pain for __________ seconds.  Relax the muscles slowly and completely between each repetition. Repeat __________ times. Complete this exercise __________ times per day.  STRENGTH - Quadriceps, Short Arcs   Lie on your back. Place a __________ inch towel roll under your right / left knee, so that the knee bends slightly.  Raise only your lower leg by tightening the muscles in the front of your thigh. Do not allow your thigh to rise.  Hold this position for __________ seconds. Repeat __________ times. Complete this exercise __________ times per day.  OPTIONAL ANKLE WEIGHTS: Begin with ____________________, but DO NOT exceed ____________________. Increase in 1 pound/0.5 kilogram  increments. STRENGTH - Quadriceps, Straight Leg Raises  Quality counts! Watch for signs that the quadriceps muscle is working, to be sure you are strengthening the correct muscles and not "cheating" by substituting with healthier muscles.  Lie on your back with your right / left leg extended and your opposite knee bent.  Tense the muscles in the front of your right / left thigh. You should see either your kneecap slide up or increased dimpling just above the knee. Your thigh may even shake a bit.  Tighten these muscles even more and raise your leg 4 to 6 inches off the floor. Hold for __________ seconds.  Keeping these muscles tense, lower your leg.  Relax the muscles slowly and completely in between each repetition. Repeat __________ times. Complete this exercise __________ times per day.  STRENGTH - Hamstring, Isometrics   Lie on your back, on a firm surface.  Bend your right / left knee approximately __________ degrees.  Dig your heel into the surface as if you are trying to pull it  toward your buttocks. Tighten the muscles in the back of your thighs to "dig" as hard as you can, without increasing any pain.  Hold this position for __________ seconds.  Release the tension gradually and allow your muscle to completely relax for __________ seconds in between each exercise. Repeat __________ times. Complete this exercise __________ times per day.  STRENGTH - Hamstring, Curls   Lie on your stomach with your legs extended. (If you lie on a bed, your feet may hang over the edge.)  Tighten the muscles in the back of your thigh to bend your right / left knee up to 90 degrees. Keep your hips flat on the bed.  Hold this position for __________ seconds.  Slowly lower your leg back to the starting position. Repeat __________ times. Complete this exercise __________ times per day.  OPTIONAL ANKLE WEIGHTS: Begin with ____________________, but DO NOT exceed ____________________. Increase in 1  pound/0.5 kilogram increments. Document Released: 06/18/2005 Document Revised: 11/02/2013 Document Reviewed: 09/30/2008 [facility] Patient Information 2015 [employer], [place]. This information is not intended to replace advice given to you by your health care provider. Make sure you discuss any questions you have with your health care provider.   Sciatica with Rehab The sciatic nerve runs from the back down the leg and is responsible for sensation and control of the muscles in the back (posterior) side of the thigh, lower leg, and foot. Sciatica is a condition that is characterized by inflammation of this nerve.  SYMPTOMS   Signs of nerve damage, including numbness and/or weakness along the posterior side of the lower extremity.  Pain in the back of the thigh that may also travel down the leg.  Pain that worsens when sitting for long periods of time.  Occasionally, pain in the back or buttock. CAUSES  Inflammation of the sciatic nerve is the cause of sciatica. The inflammation is due to something irritating the nerve. Common sources of irritation include:  Sitting for long periods of time.  Direct trauma to the nerve.  Arthritis of the spine.  Herniated or ruptured disk.  Slipping of the vertebrae (spondylolisthesis).  Pressure from soft tissues, such as muscles or ligament-like tissue (fascia). RISK INCREASES WITH:  Sports that place pressure or stress on the spine (football or weightlifting).  Poor strength and flexibility.  Failure to warm up properly before activity.  Family history of low back pain or disk disorders.  Previous back injury or surgery.  Poor body mechanics, especially when lifting, or poor posture. PREVENTION   Warm up and stretch properly before activity.  Maintain physical fitness:  Strength, flexibility, and endurance.  Cardiovascular fitness.  Learn and use proper technique, especially with posture and lifting. When possible, have coach correct  improper technique.  Avoid activities that place stress on the spine. PROGNOSIS If treated properly, then sciatica usually resolves within 6 weeks. However, occasionally surgery is necessary.  RELATED COMPLICATIONS   Permanent nerve damage, including pain, numbness, tingle, or weakness.  Chronic back pain.  Risks of surgery: infection, bleeding, nerve damage, or damage to surrounding tissues. TREATMENT Treatment initially involves resting from any activities that aggravate your symptoms. The use of ice and medication may help reduce pain and inflammation. The use of strengthening and stretching exercises may help reduce pain with activity. These exercises may be performed at home or with referral to a therapist. A therapist may recommend further treatments, such as transcutaneous electronic nerve stimulation (TENS) or ultrasound. Your caregiver may recommend corticosteroid injections to help  reduce inflammation of the sciatic nerve. If symptoms persist despite non-surgical (conservative) treatment, then surgery may be recommended. MEDICATION  If pain medication is necessary, then nonsteroidal anti-inflammatory medications, such as aspirin and ibuprofen, or other minor pain relievers, such as acetaminophen, are often recommended.  Do not take pain medication for 7 days before surgery.  Prescription pain relievers may be given if deemed necessary by your caregiver. Use only as directed and only as much as you need.  Ointments applied to the skin may be helpful.  Corticosteroid injections may be given by your caregiver. These injections should be reserved for the most serious cases, because they may only be given a certain number of times. HEAT AND COLD  Cold treatment (icing) relieves pain and reduces inflammation. Cold treatment should be applied for 10 to 15 minutes every 2 to 3 hours for inflammation and pain and immediately after any activity that aggravates your symptoms. Use ice packs  or massage the area with a piece of ice (ice massage).  Heat treatment may be used prior to performing the stretching and strengthening activities prescribed by your caregiver, physical therapist, or athletic trainer. Use a heat pack or soak the injury in warm water. SEEK MEDICAL CARE IF:  Treatment seems to offer no benefit, or the condition worsens.  Any medications produce adverse side effects. EXERCISES  RANGE OF MOTION (ROM) AND STRETCHING EXERCISES - Sciatica Most people with sciatic will find that their symptoms worsen with either excessive bending forward (flexion) or arching at the low back (extension). The exercises which will help resolve your symptoms will focus on the opposite motion. Your physician, physical therapist or athletic trainer will help you determine which exercises will be most helpful to resolve your low back pain. Do not complete any exercises without first consulting with your clinician. Discontinue any exercises which worsen your symptoms until you speak to your clinician. If you have pain, numbness or tingling which travels down into your buttocks, leg or foot, the goal of the therapy is for these symptoms to move closer to your back and eventually resolve. Occasionally, these leg symptoms will get better, but your low back pain may worsen; this is typically an indication of progress in your rehabilitation. Be certain to be very alert to any changes in your symptoms and the activities in which you participated in the 24 hours prior to the change. Sharing this information with your clinician will allow him/her to most efficiently treat your condition. These exercises may help you when beginning to rehabilitate your injury. Your symptoms may resolve with or without further involvement from your physician, physical therapist or athletic trainer. While completing these exercises, remember:   Restoring tissue flexibility helps normal motion to return to the joints. This allows  healthier, less painful movement and activity.  An effective stretch should be held for at least 30 seconds.  A stretch should never be painful. You should only feel a gentle lengthening or release in the stretched tissue. FLEXION RANGE OF MOTION AND STRETCHING EXERCISES: STRETCH - Flexion, Single Knee to Chest   Lie on a firm bed or floor with both legs extended in front of you.  Keeping one leg in contact with the floor, bring your opposite knee to your chest. Hold your leg in place by either grabbing behind your thigh or at your knee.  Pull until you feel a gentle stretch in your low back. Hold __________ seconds.  Slowly release your grasp and repeat the exercise with the  opposite side. Repeat __________ times. Complete this exercise __________ times per day.  STRETCH - Flexion, Double Knee to Chest  Lie on a firm bed or floor with both legs extended in front of you.  Keeping one leg in contact with the floor, bring your opposite knee to your chest.  Tense your stomach muscles to support your back and then lift your other knee to your chest. Hold your legs in place by either grabbing behind your thighs or at your knees.  Pull both knees toward your chest until you feel a gentle stretch in your low back. Hold __________ seconds.  Tense your stomach muscles and slowly return one leg at a time to the floor. Repeat __________ times. Complete this exercise __________ times per day.  STRETCH - Low Trunk Rotation   Lie on a firm bed or floor. Keeping your legs in front of you, bend your knees so they are both pointed toward the ceiling and your feet are flat on the floor.  Extend your arms out to the side. This will stabilize your upper body by keeping your shoulders in contact with the floor.  Gently and slowly drop both knees together to one side until you feel a gentle stretch in your low back. Hold for __________ seconds.  Tense your stomach muscles to support your low back as  you bring your knees back to the starting position. Repeat the exercise to the other side. Repeat __________ times. Complete this exercise __________ times per day  EXTENSION RANGE OF MOTION AND FLEXIBILITY EXERCISES: STRETCH - Extension, Prone on Elbows  Lie on your stomach on the floor, a bed will be too soft. Place your palms about shoulder width apart and at the height of your head.  Place your elbows under your shoulders. If this is too painful, stack pillows under your chest.  Allow your body to relax so that your hips drop lower and make contact more completely with the floor.  Hold this position for __________ seconds.  Slowly return to lying flat on the floor. Repeat __________ times. Complete this exercise __________ times per day.  RANGE OF MOTION - Extension, Prone Press Ups  Lie on your stomach on the floor, a bed will be too soft. Place your palms about shoulder width apart and at the height of your head.  Keeping your back as relaxed as possible, slowly straighten your elbows while keeping your hips on the floor. You may adjust the placement of your hands to maximize your comfort. As you gain motion, your hands will come more underneath your shoulders.  Hold this position __________ seconds.  Slowly return to lying flat on the floor. Repeat __________ times. Complete this exercise __________ times per day.  STRENGTHENING EXERCISES - Sciatica  These exercises may help you when beginning to rehabilitate your injury. These exercises should be done near your "sweet spot." This is the neutral, low-back arch, somewhere between fully rounded and fully arched, that is your least painful position. When performed in this safe range of motion, these exercises can be used for people who have either a flexion or extension based injury. These exercises may resolve your symptoms with or without further involvement from your physician, physical therapist or athletic trainer. While completing  these exercises, remember:   Muscles can gain both the endurance and the strength needed for everyday activities through controlled exercises.  Complete these exercises as instructed by your physician, physical therapist or athletic trainer. Progress with the resistance and repetition  exercises only as your caregiver advises.  You may experience muscle soreness or fatigue, but the pain or discomfort you are trying to eliminate should never worsen during these exercises. If this pain does worsen, stop and make certain you are following the directions exactly. If the pain is still present after adjustments, discontinue the exercise until you can discuss the trouble with your clinician. STRENGTHENING - Deep Abdominals, Pelvic Tilt   Lie on a firm bed or floor. Keeping your legs in front of you, bend your knees so they are both pointed toward the ceiling and your feet are flat on the floor.  Tense your lower abdominal muscles to press your low back into the floor. This motion will rotate your pelvis so that your tail bone is scooping upwards rather than pointing at your feet or into the floor.  With a gentle tension and even breathing, hold this position for __________ seconds. Repeat __________ times. Complete this exercise __________ times per day.  STRENGTHENING - Abdominals, Crunches   Lie on a firm bed or floor. Keeping your legs in front of you, bend your knees so they are both pointed toward the ceiling and your feet are flat on the floor. Cross your arms over your chest.  Slightly tip your chin down without bending your neck.  Tense your abdominals and slowly lift your trunk high enough to just clear your shoulder blades. Lifting higher can put excessive stress on the low back and does not further strengthen your abdominal muscles.  Control your return to the starting position. Repeat __________ times. Complete this exercise __________ times per day.  STRENGTHENING - Quadruped, Opposite  UE/LE Lift  Assume a hands and knees position on a firm surface. Keep your hands under your shoulders and your knees under your hips. You may place padding under your knees for comfort.  Find your neutral spine and gently tense your abdominal muscles so that you can maintain this position. Your shoulders and hips should form a rectangle that is parallel with the floor and is not twisted.  Keeping your trunk steady, lift your right hand no higher than your shoulder and then your left leg no higher than your hip. Make sure you are not holding your breath. Hold this position __________ seconds.  Continuing to keep your abdominal muscles tense and your back steady, slowly return to your starting position. Repeat with the opposite arm and leg. Repeat __________ times. Complete this exercise __________ times per day.  STRENGTHENING - Abdominals and Quadriceps, Straight Leg Raise   Lie on a firm bed or floor with both legs extended in front of you.  Keeping one leg in contact with the floor, bend the other knee so that your foot can rest flat on the floor.  Find your neutral spine, and tense your abdominal muscles to maintain your spinal position throughout the exercise.  Slowly lift your straight leg off the floor about 6 inches for a count of 15, making sure to not hold your breath.  Still keeping your neutral spine, slowly lower your leg all the way to the floor. Repeat this exercise with each leg __________ times. Complete this exercise __________ times per day. POSTURE AND BODY MECHANICS CONSIDERATIONS - Sciatica Keeping correct posture when sitting, standing or completing your activities will reduce the stress put on different body tissues, allowing injured tissues a chance to heal and limiting painful experiences. The following are general guidelines for improved posture. Your physician or physical therapist will provide you  with any instructions specific to your needs. While reading these  guidelines, remember:  The exercises prescribed by your provider will help you have the flexibility and strength to maintain correct postures.  The correct posture provides the optimal environment for your joints to work. All of your joints have less wear and tear when properly supported by a spine with good posture. This means you will experience a healthier, less painful body.  Correct posture must be practiced with all of your activities, especially prolonged sitting and standing. Correct posture is as important when doing repetitive low-stress activities (typing) as it is when doing a single heavy-load activity (lifting). RESTING POSITIONS Consider which positions are most painful for you when choosing a resting position. If you have pain with flexion-based activities (sitting, bending, stooping, squatting), choose a position that allows you to rest in a less flexed posture. You would want to avoid curling into a fetal position on your side. If your pain worsens with extension-based activities (prolonged standing, working overhead), avoid resting in an extended position such as sleeping on your stomach. Most people will find more comfort when they rest with their spine in a more neutral position, neither too rounded nor too arched. Lying on a non-sagging bed on your side with a pillow between your knees, or on your back with a pillow under your knees will often provide some relief. Keep in mind, being in any one position for a prolonged period of time, no matter how correct your posture, can still lead to stiffness. PROPER SITTING POSTURE In order to minimize stress and discomfort on your spine, you must sit with correct posture Sitting with good posture should be effortless for a healthy body. Returning to good posture is a gradual process. Many people can work toward this most comfortably by using various supports until they have the flexibility and strength to maintain this posture on their  own. When sitting with proper posture, your ears will fall over your shoulders and your shoulders will fall over your hips. You should use the back of the chair to support your upper back. Your low back will be in a neutral position, just slightly arched. You may place a small pillow or folded towel at the base of your low back for support.  When working at a desk, create an environment that supports good, upright posture. Without extra support, muscles fatigue and lead to excessive strain on joints and other tissues. Keep these recommendations in mind: CHAIR:   A chair should be able to slide under your desk when your back makes contact with the back of the chair. This allows you to work closely.  The chair's height should allow your eyes to be level with the upper part of your monitor and your hands to be slightly lower than your elbows. BODY POSITION  Your feet should make contact with the floor. If this is not possible, use a foot rest.  Keep your ears over your shoulders. This will reduce stress on your neck and low back. INCORRECT SITTING POSTURES   If you are feeling tired and unable to assume a healthy sitting posture, do not slouch or slump. This puts excessive strain on your back tissues, causing more damage and pain. Healthier options include:  Using more support, like a lumbar pillow.  Switching tasks to something that requires you to be upright or walking.  Talking a brief walk.  Lying down to rest in a neutral-spine position. PROLONGED STANDING WHILE SLIGHTLY LEANING FORWARD  When completing a task that requires you to lean forward while standing in one place for a long time, place either foot up on a stationary 2-4 inch high object to help maintain the best posture. When both feet are on the ground, the low back tends to lose its slight inward curve. If this curve flattens (or becomes too large), then the back and your other joints will experience too much stress, fatigue more  quickly and can cause pain.  CORRECT STANDING POSTURES Proper standing posture should be assumed with all daily activities, even if they only take a few moments, like when brushing your teeth. As in sitting, your ears should fall over your shoulders and your shoulders should fall over your hips. You should keep a slight tension in your abdominal muscles to brace your spine. Your tailbone should point down to the ground, not behind your body, resulting in an over-extended swayback posture.  INCORRECT STANDING POSTURES  Common incorrect standing postures include a forward head, locked knees and/or an excessive swayback. WALKING Walk with an upright posture. Your ears, shoulders and hips should all line-up. PROLONGED ACTIVITY IN A FLEXED POSITION When completing a task that requires you to bend forward at your waist or lean over a low surface, try to find a way to stabilize 3 of 4 of your limbs. You can place a hand or elbow on your thigh or rest a knee on the surface you are reaching across. This will provide you more stability so that your muscles do not fatigue as quickly. By keeping your knees relaxed, or slightly bent, you will also reduce stress across your low back. CORRECT LIFTING TECHNIQUES DO :   Assume a wide stance. This will provide you more stability and the opportunity to get as close as possible to the object which you are lifting.  Tense your abdominals to brace your spine; then bend at the knees and hips. Keeping your back locked in a neutral-spine position, lift using your leg muscles. Lift with your legs, keeping your back straight.  Test the weight of unknown objects before attempting to lift them.  Try to keep your elbows locked down at your sides in order get the best strength from your shoulders when carrying an object.  Always ask for help when lifting heavy or awkward objects. INCORRECT LIFTING TECHNIQUES DO NOT:   Lock your knees when lifting, even if it is a small  object.  Bend and twist. Pivot at your feet or move your feet when needing to change directions.  Assume that you cannot safely pick up a paperclip without proper posture. Document Released: 06/18/2005 Document Revised: 11/02/2013 Document Reviewed: 09/30/2008 [facility] Patient Information 2015 [place], [place]. This information is not intended to replace advice given to you by your health care provider. Make sure you discuss any questions you have with your health care provider.

## 2014-09-09 ENCOUNTER — Emergency Department (HOSPITAL_COMMUNITY)
Admission: EM | Admit: 2014-09-09 | Discharge: 2014-09-09 | Disposition: A | Discharge status: Home / Self Care | Attending: Emergency Medicine | Admitting: Emergency Medicine

## 2014-09-09 ENCOUNTER — Encounter (HOSPITAL_COMMUNITY): Payer: Self-pay | Admitting: Emergency Medicine

## 2014-09-09 DIAGNOSIS — H16133 Photokeratitis, bilateral: Secondary | ICD-10-CM

## 2014-09-09 DIAGNOSIS — R51 Headache: Secondary | ICD-10-CM | POA: Insufficient documentation

## 2014-09-09 DIAGNOSIS — H209 Unspecified iridocyclitis: Secondary | ICD-10-CM | POA: Insufficient documentation

## 2014-09-09 MED ORDER — KETOROLAC TROMETHAMINE 0.5 % OP SOLN: 1.0000 [drp] | Freq: Four times a day (QID) | OPHTHALMIC | Status: DC

## 2014-09-09 MED ORDER — FLUORESCEIN SODIUM 1 MG OP STRP
1.0000 | ORAL_STRIP | Freq: Once | OPHTHALMIC | Status: AC
  Administered 2014-09-09: 1 via OPHTHALMIC
  Filled 2014-09-09: qty 1

## 2014-09-09 MED ORDER — TETRACAINE HCL 0.5 % OP SOLN
2.0000 [drp] | Freq: Once | OPHTHALMIC | Status: AC
  Administered 2014-09-09: 2 [drp] via OPHTHALMIC
  Filled 2014-09-09: qty 2

## 2014-09-09 MED ORDER — HYDROCODONE-ACETAMINOPHEN 5-325 MG PO TABS: 1.0000 | ORAL_TABLET | ORAL | Status: DC | PRN

## 2014-09-09 NOTE — ED Notes (Signed)
Patient c/o bilateral eye pain and blurred vision. 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 Patient c/o p[ain in back of eyes from watching flashes of light.

## 2014-09-09 NOTE — Discharge Instructions (Signed)
Please use dark glasses over the next few days. Please use a hat with a brim. Please try to avoid bright lights. Use Acular there every 6 hours for discomfort. Use Tylenol or ibuprofen for mild headache or pain, use Norco for more severe headache or pain. This medication may cause drowsiness. Please do not drink, drive, or participate in activity that requires concentration while taking this medication. Ultraviolet Keratitis Ultraviolet keratitis can occur when too much UV light enters the cornea. The cornea is the clear cover on the front part of your eye that helps focus light. It protects your eyes from dust and other foreign bodies and filters ultraviolet rays. This condition can be caused by exposure to snow (snow blindness) from the reflected or direct sunlight. It may also be caused by exposure to welding arcs or halogen lamps (flash burn) and prolonged exposure to direct sunlight. Brief exposure can result in severe problems several hours later. CAUSES  Causes of ultraviolet keratitis include:  Exposure to snow (snow blindness) from the reflected or direct sunlight.  Exposure to welding arcs or halogen lamps (flash burn).  Prolonged exposure to direct sunlight. SYMPTOMS  The symptoms of ultraviolet keratitis usually start 6 to 12 hours after the damage occurred. They may include the following:   Tearing.  Light sensitivity.  Gritty sensation in eyes.  Swelling of your eyelids.  Severe pain. In spite of the pain, this condition will usually improve within 24 hours even without treatment. HOME CARE INSTRUCTIONS   Apply cold packs on your eyes to ease the pain.  Only take over-the-counter or prescription medicines for pain, discomfort, or fever as directed by your caregiver.  Your caregiver may also prescribe an antibiotic ointment to help prevent infection and/or additional medications for pain relief.  Apply an eye patch to help relieve discomfort and assist in healing. If your  caregiver patches your eyes, it is important to leave these patches on.  Follow the instructions given to you by your caregiver.  Do not rub your eyes.  If your caregiver has given you a follow-up appointment, it is very important to keep that appointment. Not keeping the appointment could result in a severe eye infection or permanent loss of vision. If there is any problem keeping the appointment, you must call back to this facility for assistance. SEEK IMMEDIATE MEDICAL CARE IF:   Pain is severe and not relieved by medication.  Pain or problems with vision last more than 48 hours.  Exposure to light is unavoidable and you need extra protection for your eyes. MAKE SURE YOU:   Understand these instructions.  Will watch your condition.  Will get help right away if you are not doing well or get worse. Document Released: 06/18/2005 Document Revised: 11/02/2013 Document Reviewed: 01/23/2008 [facility] Patient Information 2015 [place]. This information is not intended to replace advice given to you by your health care provider. Make sure you discuss any questions you have with your health care provider.

## 2014-09-09 NOTE — ED Provider Notes (Signed)
CSN: [id number]     Arrival date & time 09/09/14  1035 History   First MD Initiated Contact with Patient 09/09/14 1050     Chief Complaint  Patient presents with  . 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 He now has pain and photophobia. No reported FB issues.  Patient is a 24 y.o. male presenting with eye pain. The history is provided by the patient.  Eye Pain The current episode started yesterday. The problem occurs constantly. The problem has been gradually worsening. Associated symptoms include headaches and a visual change. Pertinent negatives include no fever or nausea. Exacerbated by: bright light. He has tried nothing for the symptoms. The treatment provided no relief.    History reviewed. No pertinent past medical history. Past Surgical History  Procedure Laterality Date  . Appendectomy     Family History  Problem Relation Age of Onset  . Hypertension Mother   . Heart failure Mother   . Hypertension Father   . Heart failure Father    History  Substance Use Topics  . Smoking status: Never Smoker   . Smokeless tobacco: Never Used  . Alcohol Use: No    Review of Systems  Constitutional: Negative for fever.  Eyes: Positive for pain.  Gastrointestinal: Negative for nausea.  Neurological: Positive for headaches.  All other systems reviewed and are negative.     Allergies  Review of patient's allergies indicates no known allergies.  Home Medications   Prior to Admission medications   Medication Sig Start Date End Date Taking? Authorizing Provider  traZODone (DESYREL) 50 MG tablet Take 50 mg by mouth at bedtime.   Yes Historical Provider, MD  diclofenac sodium (VOLTAREN) 1 % GEL Apply 2-4 g topically 4 (four) times daily. Patient not taking: Reported on 09/09/2014 04/13/14   Waldemar Dickens, MD  hydrOXYzine (VISTARIL) 25  MG capsule Take 1 capsule (25 mg total) by mouth 3 (three) times daily as needed for itching. Patient not taking: Reported on 09/09/2014 03/11/14   Ashley Murrain, NP  predniSONE (STERAPRED UNI-PAK) 10 MG tablet Take by mouth daily. Take 6 tablets PO today followed by 5, 4, 3, 2, 1 Patient not taking: Reported on 09/09/2014 03/11/14   Ashley Murrain, NP  PredniSONE 10 MG KIT 12 day dose pack Patient not taking: Reported on 09/09/2014 04/13/14   Waldemar Dickens, MD   BP 146/84 mmHg  Pulse 72  Temp(Src) 97.9 F (36.6 C) (Oral)  Resp 16  Ht _0  (1.702 m)  Wt 196 lb (88.905 kg)  BMI 30.69 kg/m2  SpO2 99% Physical Exam  Constitutional: He is oriented to person, place, and time. He appears well-developed and well-nourished.  Non-toxic appearance.  HENT:  Head: Normocephalic.  Right Ear: Tympanic membrane and external ear normal.  Left Ear: Tympanic membrane and external ear normal.  Eyes: EOM and lids are normal. Pupils are equal, round, and reactive to light.  Fundoscopic exam:      The right eye shows no arteriolar narrowing, no AV nicking and no papilledema.       The left eye shows no arteriolar narrowing, no AV nicking and no papilledema.  Slit lamp exam:      The right eye shows fluorescein uptake. The right eye shows no corneal abrasion, no corneal ulcer, no foreign body and no hyphema.       The left eye  shows fluorescein uptake. The left eye shows no corneal abrasion, no corneal ulcer, no foreign body and no hyphema.  Diffuse fluorescein uptake. No abrasion or fb.  Neck: Normal range of motion. Neck supple. Carotid bruit is not present.  Cardiovascular: Normal rate, regular rhythm, normal heart sounds, intact distal pulses and normal pulses.   Pulmonary/Chest: Breath sounds normal. No respiratory distress.  Abdominal: Soft. Bowel sounds are normal. There is no tenderness. There is no guarding.  Musculoskeletal: Normal range of motion.  Lymphadenopathy:       Head (right side): No  submandibular adenopathy present.       Head (left side): No submandibular adenopathy present.    He has no cervical adenopathy.  Neurological: He is alert and oriented to person, place, and time. He has normal strength. No cranial nerve deficit or sensory deficit.  Skin: Skin is warm and dry.  Psychiatric: He has a normal mood and affect. His speech is normal.  Nursing note and vitals reviewed.   ED Course  Procedures (including critical care time) Labs Review Labs Reviewed - No data to display  Imaging Review No results found.   EKG Interpretation None      MDM  Vital signs within normal limits. Visual acuity reviewed. There is diffuse uptake of the fluoroscopy seen during the slit-lamp examination, but no foreign body, and no abrasion or ulcer. 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 Will use Acular eyedrops and Norco for pain. Patient is asked to use dark glasses and try to avoid bright lights. Patient will return if not improving.    Final diagnoses:  None    **I have reviewed nursing notes, vital signs, and all appropriate lab and imaging results for this patient.Lily Kocher, PA-C 09/09/14 [place], PA-C 09/11/14 1052  Milton Ferguson, MD 09/14/14 [phone]

## 2014-12-25 IMAGING — CR DG KNEE COMPLETE 4+V*L*
4 series · 4 of 4 positions shown · non-contrast
Comparison: None.

CLINICAL DATA: Numbness and tingling from the left knee to the toes

EXAM:
LEFT KNEE - COMPLETE 4+ VIEW

[knee ap]
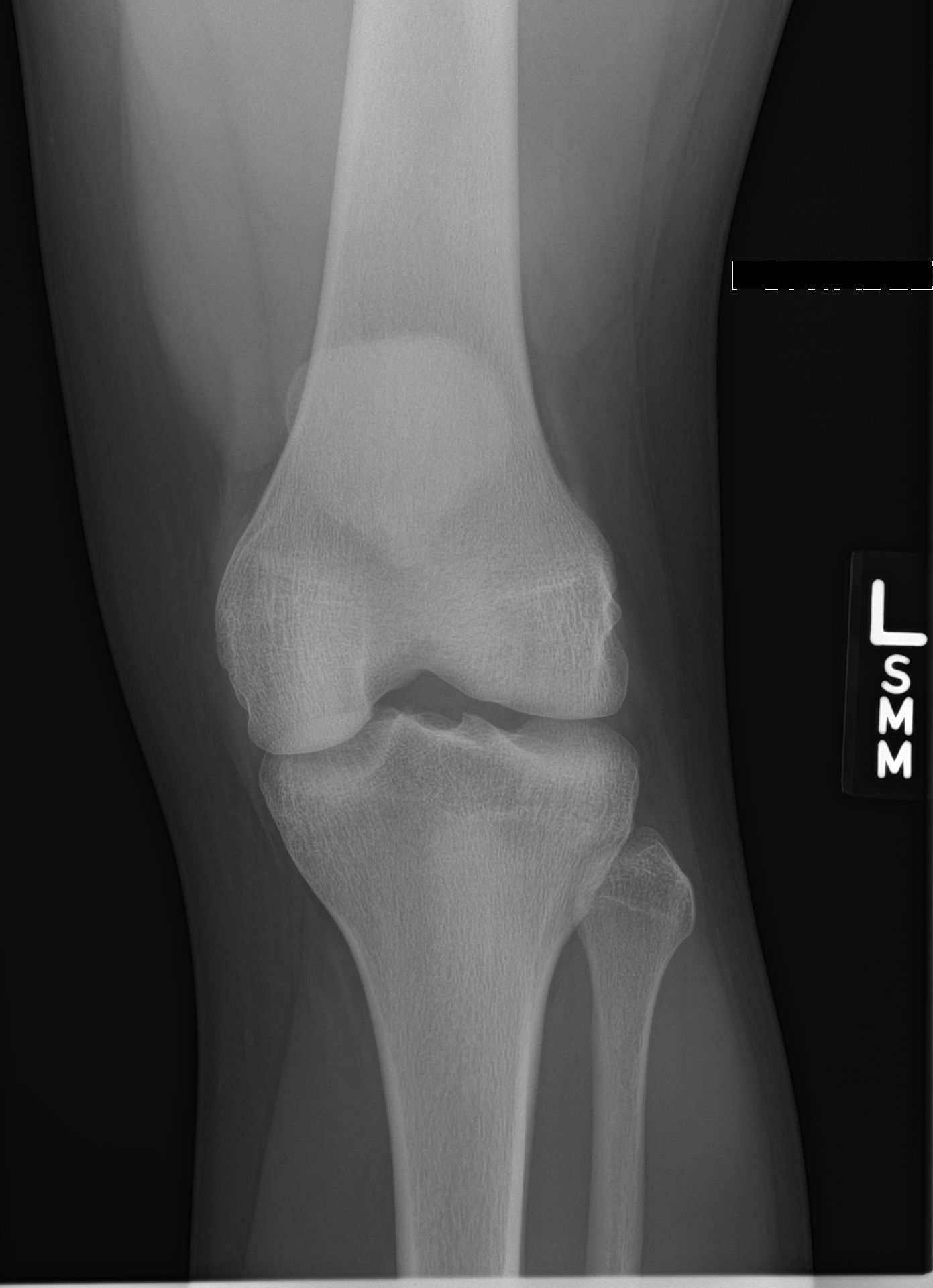

[knee lat]
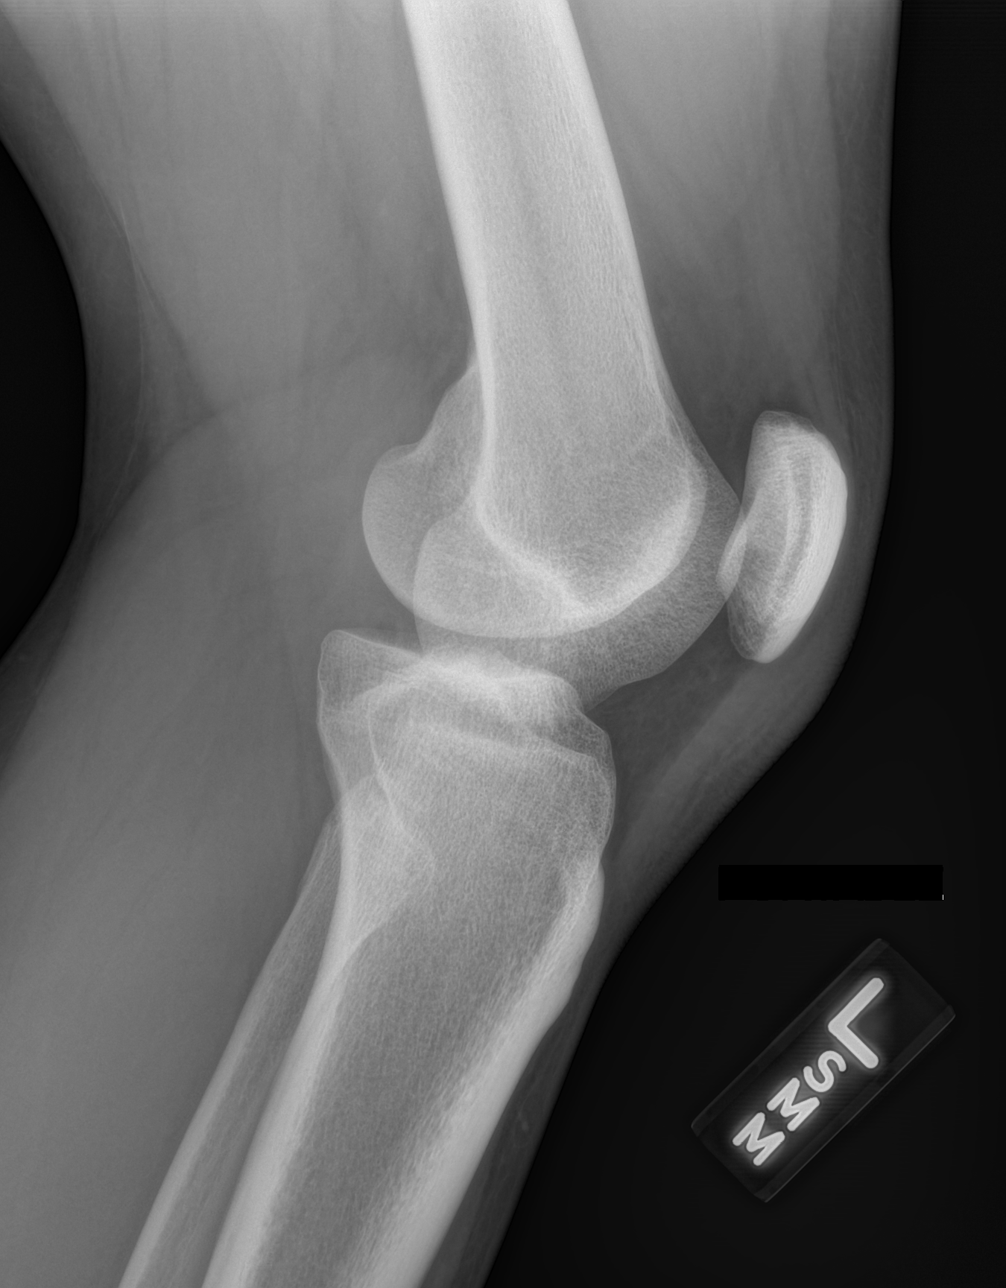

[knee obl (1 of 2)]
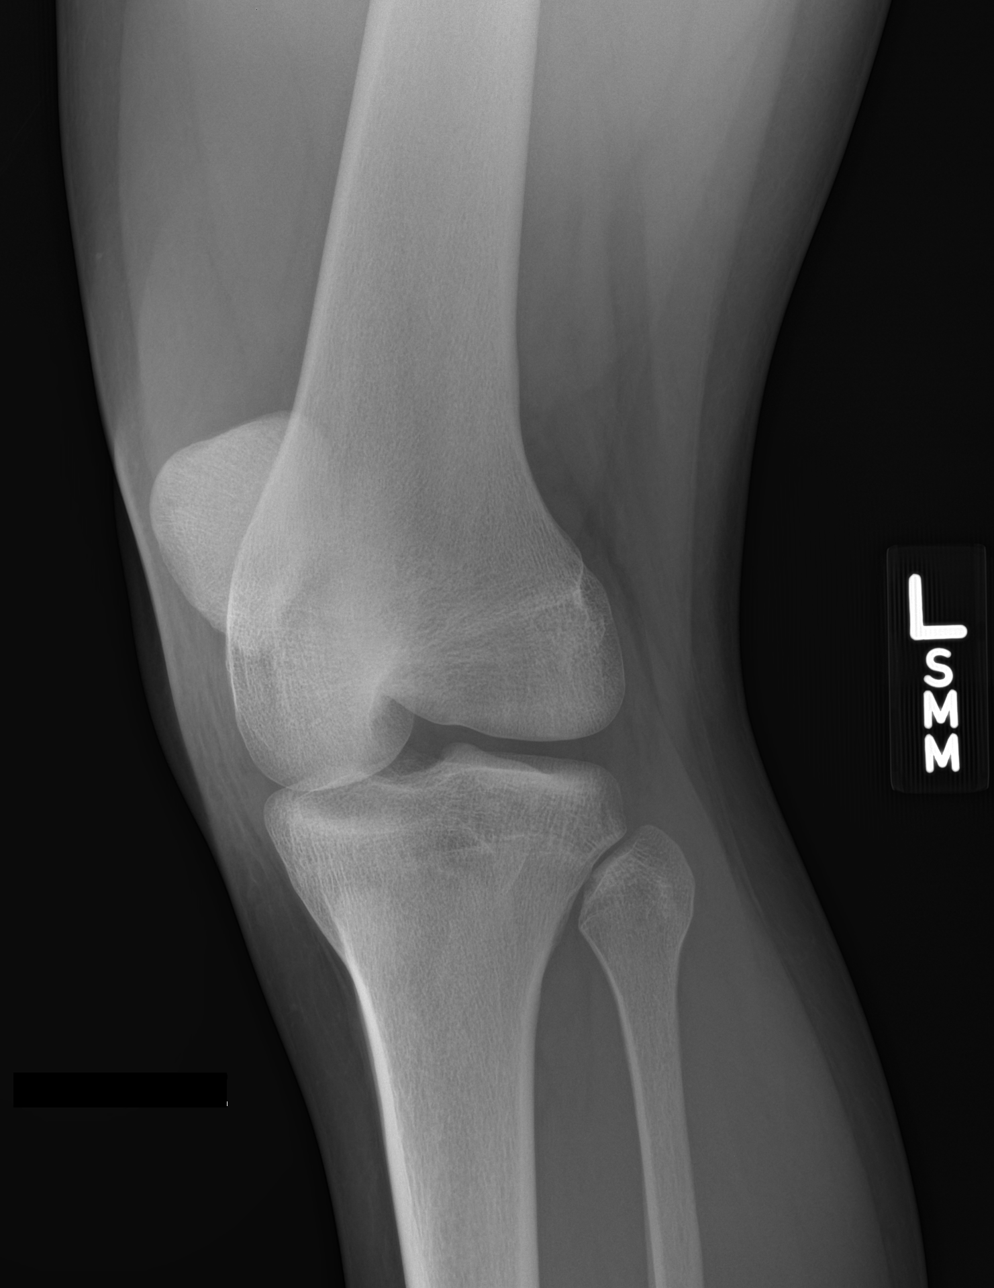

[knee obl (2 of 2)]
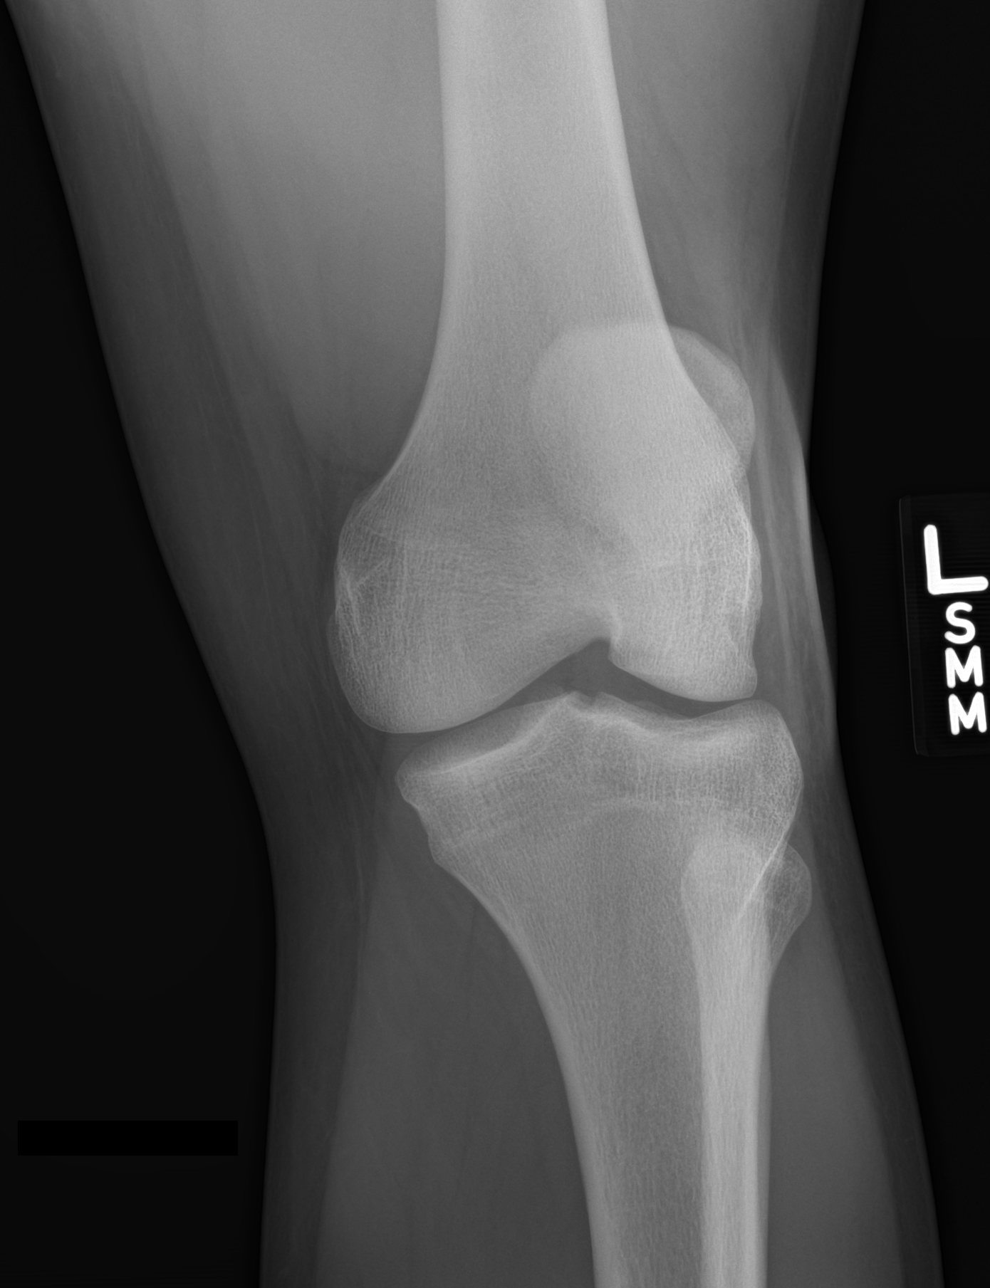

[4 of 4 positions shown; findings below may reference images not displayed]

FINDINGS: There is no evidence of fracture, dislocation, or joint effusion.
There is no evidence of arthropathy or other focal bone abnormality.
Soft tissues are unremarkable.
IMPRESSION: Negative.

## 2016-12-14 ENCOUNTER — Emergency Department (HOSPITAL_COMMUNITY)
Admission: EM | Admit: 2016-12-14 | Discharge: 2016-12-14 | Disposition: A | Discharge status: Home / Self Care | Payer: Self-pay | Attending: Emergency Medicine | Admitting: Emergency Medicine

## 2016-12-14 ENCOUNTER — Emergency Department (HOSPITAL_COMMUNITY): Payer: Self-pay

## 2016-12-14 ENCOUNTER — Encounter (HOSPITAL_COMMUNITY): Payer: Self-pay | Admitting: Emergency Medicine

## 2016-12-14 DIAGNOSIS — M25511 Pain in right shoulder: Secondary | ICD-10-CM | POA: Insufficient documentation

## 2016-12-14 MED ORDER — DICLOFENAC SODIUM 75 MG PO TBEC: 75.0000 mg | DELAYED_RELEASE_TABLET | Freq: Two times a day (BID) | ORAL | 0 refills | Status: DC

## 2016-12-14 NOTE — ED Provider Notes (Signed)
[facility] DEPT Provider Note   CSN: [id number] Arrival date & time: 12/14/16  1951     History   Chief Complaint Chief Complaint  Patient presents with  . Shoulder Pain    HPI Tim Tucker is a 26 y.o. male.  HPI   Tim Tucker is a 26 y.o. male who presents to the Emergency Department complaining of right shoulder pain for three days.  He describes a sharp, pain tot he front of the shoulder this is worse with raising the arm up and picking up heavy objects.  Pain resolves when arm is at rest.  He has tried ibuprofen, ice and heat without relief.  He denies known injury, but admits to playing softball weekly.  He denies neck pain, numbness or the arm, chest pain, headaches or swelling.    History reviewed. No pertinent past medical history.  There are no active problems to display for this patient.   Past Surgical History:  Procedure Laterality Date  . APPENDECTOMY         Home Medications    Prior to Admission medications   Medication Sig Start Date End Date Taking? Authorizing Provider  diclofenac (VOLTAREN) 75 MG EC tablet Take 1 tablet (75 mg total) by mouth 2 (two) times daily. Take with food 12/14/16   Sanjna Haskew, PA-C  diclofenac sodium (VOLTAREN) 1 % GEL Apply 2-4 g topically 4 (four) times daily. Patient not taking: Reported on 09/09/2014 04/13/14   Waldemar Dickens, MD  HYDROcodone-acetaminophen (NORCO/VICODIN) 5-325 MG per tablet Take 1 tablet by mouth every 4 (four) hours as needed. 09/09/14   Lily Kocher, PA-C  hydrOXYzine (VISTARIL) 25 MG capsule Take 1 capsule (25 mg total) by mouth 3 (three) times daily as needed for itching. Patient not taking: Reported on 09/09/2014 03/11/14   Ashley Murrain, NP  ketorolac (ACULAR) 0.5 % ophthalmic solution Place 1 drop into both eyes every 6 (six) hours. 09/09/14   Lily Kocher, PA-C  predniSONE (STERAPRED UNI-PAK) 10 MG tablet Take by mouth daily. Take 6 tablets PO today followed by 5, 4, 3, 2, 1 Patient not  taking: Reported on 09/09/2014 03/11/14   Ashley Murrain, NP  PredniSONE 10 MG KIT 12 day dose pack Patient not taking: Reported on 09/09/2014 04/13/14   Waldemar Dickens, MD  traZODone (DESYREL) 50 MG tablet Take 50 mg by mouth at bedtime.    [provider]    Family History Family History  Problem Relation Age of Onset  . Hypertension Mother   . Heart failure Mother   . Hypertension Father   . Heart failure Father     Social History Social History  Substance Use Topics  . Smoking status: Never Smoker  . Smokeless tobacco: Never Used  . Alcohol use No     Allergies   Patient has no known allergies.   Review of Systems Review of Systems  Constitutional: Negative for chills and fever.  Musculoskeletal: Positive for arthralgias (right shoulder pain). Negative for joint swelling, neck pain and neck stiffness.  Skin: Negative for color change and wound.  Neurological: Negative for dizziness, weakness and numbness.  All other systems reviewed and are negative.    Physical Exam Updated Vital Signs BP 140/90 (BP Location: Left Arm)   Pulse 80   Temp 99 F (37.2 C) (Oral)   Resp 18   Ht '5\' 9"'  (1.753 m)   Wt 99.8 kg (220 lb)   SpO2 100%   BMI 32.49 kg/m  Physical Exam  Constitutional: He is oriented to person, place, and time. He appears well-developed and well-nourished. No distress.  HENT:  Head: Normocephalic and atraumatic.  Neck: Normal range of motion and phonation normal. Neck supple. No spinous process tenderness and no muscular tenderness present. Normal range of motion present. No thyromegaly present.  Cardiovascular: Normal rate, regular rhythm and intact distal pulses.   No murmur heard. Pulmonary/Chest: Effort normal and breath sounds normal. No respiratory distress. He exhibits no tenderness.  Musculoskeletal: He exhibits tenderness. He exhibits no edema.  ttp of the anterior right shoulder.  Pain with abduction of the right arm and rotation of  the shoulder.  Grip strength is strong and symmetrical.   No abrasions, edema , erythema or step-off deformity of the joint.   Lymphadenopathy:    He has no cervical adenopathy.  Neurological: He is alert and oriented to person, place, and time. He has normal strength. No sensory deficit. He exhibits normal muscle tone. Coordination normal.  Reflex Scores:      Tricep reflexes are 1+ on the right side and 2+ on the left side.      Bicep reflexes are 1+ on the right side and 2+ on the left side. Skin: Skin is warm and dry. Capillary refill takes less than 2 seconds.  Nursing note and vitals reviewed.    ED Treatments / Results  Labs (all labs ordered are listed, but only abnormal results are displayed) Labs Reviewed - No data to display  EKG  EKG Interpretation None       Radiology Dg Shoulder Right  Result Date: 12/14/2016 CLINICAL DATA:  Right shoulder pain. EXAM: RIGHT SHOULDER - 2+ VIEW COMPARISON:  None. FINDINGS: There is no evidence of fracture or dislocation. There is no evidence of arthropathy or other focal bone abnormality. Soft tissues are unremarkable. IMPRESSION: Negative. Electronically Signed   By: Fidela Salisbury M.D.   On: 12/14/2016 21:07    Procedures Procedures (including critical care time)  Medications Ordered in ED Medications - No data to display   Initial Impression / Assessment and Plan / ED Course  I have reviewed the triage vital signs and the nursing notes.  Pertinent labs & imaging results that were available during my care of the patient were reviewed by me and considered in my medical decision making (see chart for details).     Pt well appearing.  XR neg.  NV intact.  No concerning sx's for septic joint.  Discussed possible inflammatory process vs possible injury of rotator cuff.  Appears stable for d/c.  Agrees to ice, rest, NSAID and close orthopedic f/u  Final Clinical Impressions(s) / ED Diagnoses   Final diagnoses:  Acute pain  of right shoulder    New Prescriptions Discharge Medication List as of 12/14/2016  9:18 PM    START taking these medications   Details  diclofenac (VOLTAREN) 75 MG EC tablet Take 1 tablet (75 mg total) by mouth 2 (two) times daily. Take with food, Starting Fri 12/14/2016, Print         Vanessa [place], PA-C 12/14/16 2202    Milton Ferguson, MD 12/14/16 2318

## 2016-12-14 NOTE — Discharge Instructions (Signed)
Apply ice packs on/off to your shoulder.  Stop any ibuprofen while taking the diclofenac.  Call Dr. Mort SawyersHarrison's office to arrange a follow-up appt.

## 2016-12-14 NOTE — ED Triage Notes (Signed)
Pt c/o right shoulder pain x 3 days without injury.

## 2017-04-19 ENCOUNTER — Encounter (HOSPITAL_COMMUNITY)
Admission: AD | Disposition: A | Discharge status: Home / Self Care | Payer: Self-pay | Source: Other Acute Inpatient Hospital

## 2017-04-19 ENCOUNTER — Encounter (HOSPITAL_COMMUNITY): Payer: Self-pay

## 2017-04-19 ENCOUNTER — Observation Stay (HOSPITAL_COMMUNITY): Payer: Self-pay | Admitting: Anesthesiology

## 2017-04-19 ENCOUNTER — Inpatient Hospital Stay (HOSPITAL_COMMUNITY)
Admission: AD | Admit: 2017-04-19 | Discharge: 2017-04-22 | DRG: 346 | Disposition: A | Discharge status: Home / Self Care | Payer: Self-pay | Source: Other Acute Inpatient Hospital | Attending: Surgery | Admitting: Surgery

## 2017-04-19 DIAGNOSIS — K6289 Other specified diseases of anus and rectum: Secondary | ICD-10-CM | POA: Diagnosis present

## 2017-04-19 DIAGNOSIS — Z881 Allergy status to other antibiotic agents status: Secondary | ICD-10-CM

## 2017-04-19 DIAGNOSIS — K611 Rectal abscess: Secondary | ICD-10-CM | POA: Diagnosis present

## 2017-04-19 DIAGNOSIS — Z8659 Personal history of other mental and behavioral disorders: Secondary | ICD-10-CM

## 2017-04-19 DIAGNOSIS — K612 Anorectal abscess: Principal | ICD-10-CM | POA: Diagnosis present

## 2017-04-19 DIAGNOSIS — K629 Disease of anus and rectum, unspecified: Secondary | ICD-10-CM | POA: Diagnosis present

## 2017-04-19 HISTORY — PX: INCISION AND DRAINAGE PERIRECTAL ABSCESS: SHX1804

## 2017-04-19 LAB — CBC
HEMATOCRIT: 41.6 % (ref 39.0–52.0)
HEMOGLOBIN: 14.4 g/dL (ref 13.0–17.0)
MCH: 28.5 pg (ref 26.0–34.0)
MCHC: 34.6 g/dL (ref 30.0–36.0)
MCV: 82.2 fL (ref 78.0–100.0)
Platelets: 272 10*3/uL (ref 150–400)
RBC: 5.06 MIL/uL (ref 4.22–5.81)
RDW: 12.5 % (ref 11.5–15.5)
WBC: 15.7 10*3/uL — ABNORMAL HIGH (ref 4.0–10.5)

## 2017-04-19 LAB — COMPREHENSIVE METABOLIC PANEL
ALBUMIN: 3.6 g/dL (ref 3.5–5.0)
ALT: 21 U/L (ref 17–63)
ANION GAP: 9 (ref 5–15)
AST: 15 U/L (ref 15–41)
Alkaline Phosphatase: 78 U/L (ref 38–126)
BUN: 16 mg/dL (ref 6–20)
CHLORIDE: 104 mmol/L (ref 101–111)
CO2: 25 mmol/L (ref 22–32)
Calcium: 9.1 mg/dL (ref 8.9–10.3)
Creatinine, Ser: 0.93 mg/dL (ref 0.61–1.24)
GFR calc Af Amer: >60 mL/min (ref >60)
GFR calc non Af Amer: >60 mL/min (ref >60)
GLUCOSE: 112 mg/dL — AB (ref 65–99)
POTASSIUM: 3.9 mmol/L (ref 3.5–5.1)
SODIUM: 138 mmol/L (ref 135–145)
TOTAL PROTEIN: 7.3 g/dL (ref 6.5–8.1)
Total Bilirubin: 0.7 mg/dL (ref 0.3–1.2)

## 2017-04-19 LAB — SURGICAL PCR SCREEN
MRSA, PCR: NEGATIVE
Staphylococcus aureus: NEGATIVE

## 2017-04-19 LAB — HIV ANTIBODY (ROUTINE TESTING W REFLEX): HIV SCREEN 4TH GENERATION: NONREACTIVE

## 2017-04-19 SURGERY — INCISION AND DRAINAGE, ABSCESS, PERIRECTAL
Anesthesia: General

## 2017-04-19 MED ORDER — ONDANSETRON 4 MG PO TBDP
4.0000 mg | ORAL_TABLET | Freq: Four times a day (QID) | ORAL | Status: DC | PRN
Start: 2017-04-19 — End: 2017-04-19

## 2017-04-19 MED ORDER — FENTANYL CITRATE (PF) 100 MCG/2ML IJ SOLN
25.0000 ug | INTRAMUSCULAR | Status: DC | PRN
  Administered 2017-04-19: 50 ug via INTRAVENOUS

## 2017-04-19 MED ORDER — KETOROLAC TROMETHAMINE 30 MG/ML IJ SOLN
INTRAMUSCULAR | Status: DC | PRN
  Administered 2017-04-19: 30 mg via INTRAVENOUS

## 2017-04-19 MED ORDER — PROPOFOL 10 MG/ML IV BOLUS
INTRAVENOUS | Status: AC
  Filled 2017-04-19: qty 20

## 2017-04-19 MED ORDER — ONDANSETRON HCL 4 MG/2ML IJ SOLN
INTRAMUSCULAR | Status: AC
  Filled 2017-04-19: qty 2

## 2017-04-19 MED ORDER — DIPHENHYDRAMINE HCL 50 MG/ML IJ SOLN: 25.0000 mg | Freq: Four times a day (QID) | INTRAMUSCULAR | Status: DC | PRN

## 2017-04-19 MED ORDER — KETOROLAC TROMETHAMINE 30 MG/ML IJ SOLN
INTRAMUSCULAR | Status: AC
  Filled 2017-04-19: qty 1

## 2017-04-19 MED ORDER — SODIUM CHLORIDE 0.9 % IV SOLN
INTRAVENOUS | Status: DC
  Administered 2017-04-19 – 2017-04-20 (×2): via INTRAVENOUS

## 2017-04-19 MED ORDER — ONDANSETRON HCL 4 MG/2ML IJ SOLN
INTRAMUSCULAR | Status: DC | PRN
  Administered 2017-04-19: 4 mg via INTRAVENOUS

## 2017-04-19 MED ORDER — LIDOCAINE HCL (CARDIAC) 20 MG/ML IV SOLN
INTRAVENOUS | Status: DC | PRN
  Administered 2017-04-19: 100 mg via INTRAVENOUS

## 2017-04-19 MED ORDER — KETOROLAC TROMETHAMINE 30 MG/ML IJ SOLN
30.0000 mg | Freq: Four times a day (QID) | INTRAMUSCULAR | Status: AC
  Administered 2017-04-19 – 2017-04-20 (×4): 30 mg via INTRAVENOUS
  Filled 2017-04-19 (×5): qty 1

## 2017-04-19 MED ORDER — ONDANSETRON HCL 4 MG/2ML IJ SOLN: 4.0000 mg | Freq: Four times a day (QID) | INTRAMUSCULAR | Status: DC | PRN

## 2017-04-19 MED ORDER — MORPHINE SULFATE (PF) 4 MG/ML IV SOLN
2.0000 mg | INTRAVENOUS | Status: DC | PRN
  Administered 2017-04-19 (×2): 4 mg via INTRAVENOUS
  Administered 2017-04-19: 2 mg via INTRAVENOUS
  Filled 2017-04-19 (×3): qty 1

## 2017-04-19 MED ORDER — SODIUM CHLORIDE 0.9 % IV SOLN
INTRAVENOUS | Status: DC
  Administered 2017-04-19: 03:00:00 via INTRAVENOUS

## 2017-04-19 MED ORDER — LACTATED RINGERS IV SOLN: INTRAVENOUS | Status: DC | PRN

## 2017-04-19 MED ORDER — DEXAMETHASONE SODIUM PHOSPHATE 10 MG/ML IJ SOLN
INTRAMUSCULAR | Status: DC | PRN
  Administered 2017-04-19: 10 mg via INTRAVENOUS

## 2017-04-19 MED ORDER — DEXTROSE 5 % IV SOLN
2.0000 g | INTRAVENOUS | Status: DC
  Administered 2017-04-19 – 2017-04-22 (×4): 2 g via INTRAVENOUS
  Filled 2017-04-19 (×4): qty 2

## 2017-04-19 MED ORDER — KETOROLAC TROMETHAMINE 30 MG/ML IJ SOLN: 30.0000 mg | Freq: Once | INTRAMUSCULAR | Status: DC | PRN

## 2017-04-19 MED ORDER — HYDROCODONE-ACETAMINOPHEN 5-325 MG PO TABS
1.0000 | ORAL_TABLET | ORAL | Status: DC | PRN
  Administered 2017-04-19 – 2017-04-22 (×7): 2 via ORAL
  Filled 2017-04-19 (×8): qty 2

## 2017-04-19 MED ORDER — LACTATED RINGERS IV SOLN
INTRAVENOUS | Status: DC | PRN
  Administered 2017-04-19: 14:00:00 via INTRAVENOUS

## 2017-04-19 MED ORDER — ONDANSETRON 4 MG PO TBDP
4.0000 mg | ORAL_TABLET | Freq: Four times a day (QID) | ORAL | Status: DC | PRN
  Administered 2017-04-21 – 2017-04-22 (×2): 4 mg via ORAL
  Filled 2017-04-19 (×2): qty 1

## 2017-04-19 MED ORDER — MORPHINE SULFATE (PF) 4 MG/ML IV SOLN: 1.0000 mg | INTRAVENOUS | Status: DC | PRN

## 2017-04-19 MED ORDER — PROMETHAZINE HCL 25 MG/ML IJ SOLN: 6.2500 mg | INTRAMUSCULAR | Status: DC | PRN

## 2017-04-19 MED ORDER — PROPOFOL 10 MG/ML IV BOLUS
INTRAVENOUS | Status: DC | PRN
  Administered 2017-04-19: 200 mg via INTRAVENOUS

## 2017-04-19 MED ORDER — FENTANYL CITRATE (PF) 100 MCG/2ML IJ SOLN
INTRAMUSCULAR | Status: DC | PRN
  Administered 2017-04-19 (×3): 50 ug via INTRAVENOUS

## 2017-04-19 MED ORDER — DEXAMETHASONE SODIUM PHOSPHATE 10 MG/ML IJ SOLN
INTRAMUSCULAR | Status: AC
  Filled 2017-04-19: qty 1

## 2017-04-19 MED ORDER — MIDAZOLAM HCL 5 MG/5ML IJ SOLN
INTRAMUSCULAR | Status: DC | PRN
  Administered 2017-04-19: 2 mg via INTRAVENOUS

## 2017-04-19 MED ORDER — FENTANYL CITRATE (PF) 250 MCG/5ML IJ SOLN
INTRAMUSCULAR | Status: AC
  Filled 2017-04-19: qty 5

## 2017-04-19 MED ORDER — MIDAZOLAM HCL 2 MG/2ML IJ SOLN
INTRAMUSCULAR | Status: AC
  Filled 2017-04-19: qty 2

## 2017-04-19 MED ORDER — FENTANYL CITRATE (PF) 100 MCG/2ML IJ SOLN
INTRAMUSCULAR | Status: AC
  Filled 2017-04-19: qty 2

## 2017-04-19 MED ORDER — MORPHINE SULFATE (PF) 4 MG/ML IV SOLN
2.0000 mg | INTRAVENOUS | Status: DC | PRN
  Administered 2017-04-19 – 2017-04-22 (×12): 4 mg via INTRAVENOUS
  Filled 2017-04-19 (×12): qty 1

## 2017-04-19 MED ORDER — ONDANSETRON HCL 4 MG/2ML IJ SOLN
4.0000 mg | Freq: Four times a day (QID) | INTRAMUSCULAR | Status: DC | PRN
Start: 2017-04-19 — End: 2017-04-22
  Administered 2017-04-21: 4 mg via INTRAVENOUS
  Filled 2017-04-19: qty 2

## 2017-04-19 MED ORDER — DIPHENHYDRAMINE HCL 25 MG PO CAPS: 25.0000 mg | ORAL_CAPSULE | Freq: Four times a day (QID) | ORAL | Status: DC | PRN

## 2017-04-19 MED ORDER — METRONIDAZOLE IN NACL 5-0.79 MG/ML-% IV SOLN
500.0000 mg | Freq: Three times a day (TID) | INTRAVENOUS | Status: DC
  Administered 2017-04-19 – 2017-04-22 (×10): 500 mg via INTRAVENOUS
  Filled 2017-04-19 (×12): qty 100

## 2017-04-19 MED ORDER — BUPIVACAINE-EPINEPHRINE (PF) 0.25% -1:200000 IJ SOLN
INTRAMUSCULAR | Status: AC
  Filled 2017-04-19: qty 30

## 2017-04-19 SURGICAL SUPPLY — 22 items
BLADE SURG 15 STRL LF DISP TIS (BLADE) ×1 IMPLANT
BLADE SURG 15 STRL SS (BLADE) ×2
BNDG GAUZE ELAST 4 BULKY (GAUZE/BANDAGES/DRESSINGS) ×3 IMPLANT
BRIEF STRETCH FOR OB PAD LRG (UNDERPADS AND DIAPERS) IMPLANT
COVER SURGICAL LIGHT HANDLE (MISCELLANEOUS) ×3 IMPLANT
DRSG PAD ABDOMINAL 8X10 ST (GAUZE/BANDAGES/DRESSINGS) IMPLANT
ELECT PENCIL ROCKER SW 15FT (MISCELLANEOUS) ×3 IMPLANT
ELECT REM PT RETURN 15FT ADLT (MISCELLANEOUS) ×3 IMPLANT
GAUZE SPONGE 4X4 12PLY STRL (GAUZE/BANDAGES/DRESSINGS) ×3 IMPLANT
GLOVE SURG SIGNA 7.5 PF LTX (GLOVE) ×12 IMPLANT
GOWN STRL REUS W/TWL XL LVL3 (GOWN DISPOSABLE) ×9 IMPLANT
KIT BASIN OR (CUSTOM PROCEDURE TRAY) ×3 IMPLANT
NEEDLE HYPO 22GX1.5 SAFETY (NEEDLE) ×6 IMPLANT
PACK LITHOTOMY IV (CUSTOM PROCEDURE TRAY) ×3 IMPLANT
SOL PREP POV-IOD 16OZ 10% (MISCELLANEOUS) ×3 IMPLANT
SPONGE LAP 18X18 X RAY DECT (DISPOSABLE) ×3 IMPLANT
SWAB COLLECTION DEVICE MRSA (MISCELLANEOUS) ×3 IMPLANT
SYR CONTROL 10ML LL (SYRINGE) ×3 IMPLANT
TOWEL OR 17X26 10 PK STRL BLUE (TOWEL DISPOSABLE) ×3 IMPLANT
TOWEL OR NON WOVEN STRL DISP B (DISPOSABLE) ×3 IMPLANT
UNDERPAD 30X30 (UNDERPADS AND DIAPERS) ×3 IMPLANT
YANKAUER SUCT BULB TIP 10FT TU (MISCELLANEOUS) ×3 IMPLANT

## 2017-04-19 NOTE — H&P (Signed)
Tim Tucker is an 26 y.o. male.   Chief Complaint: Perirectal abscess HPI: Patient transferred from [facility] for an anorectal abscess.  Patient reports a history of perirectal abscess about 5 years ago that underwent incision and drainage.    He has had intermittent recurrences of this abscess, which he treats with sitz baths, they normally resolved.  The current one started 2-3 days ago.  Local treatment was ineffective.  He worked 8 hours yesterday, pain was unrelenting, he could barely walk.  He was seen at [facility] rocking him CT there showed a right-sided interest sphincteric perianal fistula/abscess extending inferiorly into the right gluteal crease.  This measured 5 x 1 x 2 cm.  CMP there showed a sodium 136 potassium 4.1 chloride of 98 CO2 25.9 glucose of 98 BUN of 18.  Creatinine 0.82.  WBC 18.1 hemoglobin 15.2, hematocrit 43.6 platelets 299,000.  His fiance works at [employer] and was on the general surgery surgical team.  She requested transfer to [facility].  No beds are available and he was transferred to [facility] for admission.  History reviewed. No pertinent past medical history. History of PTSD -currently cannot afford treatment.   Past Surgical History:  Procedure Laterality Date  . APPENDECTOMY      Family History  Problem Relation Age of Onset  . Hypertension Mother   . Heart failure Mother   . Hypertension Father   . Heart failure Father    Social History:  reports that he has never smoked. He has never used smokeless tobacco. He reports that he does not drink alcohol or use drugs. Alcohol: Social Drugs: None Tobacco: None He is engaged and works as a Curator.   Allergies:  Allergies  Allergen Reactions  . Vancomycin Itching    Medications Prior to Admission  Current medications: None  Results for orders placed or performed during the hospital encounter of 04/19/17 (from the past 48 hour(s))  CBC     Status: Abnormal   Collection Time: 04/19/17  6:04 AM  Result Value Ref Range   WBC 15.7 (H) 4.0 - 10.5 K/uL   RBC 5.06 4.22 - 5.81 MIL/uL   Hemoglobin 14.4 13.0 - 17.0 g/dL   HCT 96.2 95.2 - 84.1 %   MCV 82.2 78.0 - 100.0 fL   MCH 28.5 26.0 - 34.0 pg   MCHC 34.6 30.0 - 36.0 g/dL   RDW 32.4 40.1 - 02.7 %   Platelets 272 150 - 400 K/uL   No results found.  Review of Systems  Constitutional: Negative.   HENT: Negative.   Eyes: Negative.   Respiratory: Negative.   Cardiovascular: Negative.   Gastrointestinal: Positive for diarrhea. Negative for abdominal pain, blood in stool, constipation, heartburn, melena, nausea and vomiting.  Genitourinary: Negative.   Musculoskeletal: Negative.   Skin: Negative.   Neurological: Negative.   Endo/Heme/Allergies: Negative.   Psychiatric/Behavioral: Positive for depression (Hx of PTSD/no insurance and no Rx). Negative for hallucinations, memory loss, substance abuse and suicidal ideas. The patient is nervous/anxious. The patient does not have insomnia.     Blood pressure (!) 126/58, pulse 97, temperature 99.9 F (37.7 C), temperature source Oral, resp. rate 17, height 5\' 7"  (1.702 m), weight 91.8 kg (202 lb 5 oz), SpO2 98 %. Physical Exam  Constitutional: He is oriented to person, place, and time. He appears well-developed and well-nourished. No distress.  Very anxious  HENT:  Head: Normocephalic.  Mouth/Throat: No oropharyngeal exudate.  Eyes: Pupils are  equal, round, and reactive to light. Right eye exhibits no discharge. Left eye exhibits no discharge. No scleral icterus.  Neck: Normal range of motion. Neck supple. No JVD present. No tracheal deviation present. No thyromegaly present.  Cardiovascular: Normal rate, regular rhythm, normal heart sounds and intact distal pulses.   No murmur heard. Respiratory: Effort normal and breath sounds normal. No respiratory distress. He has no wheezes. He has no rales. He exhibits no tenderness.  GI: Soft. Bowel sounds are  normal. He exhibits no distension and no mass. There is no tenderness. There is no rebound and no guarding.  RLQ scar, prior open appendectomy  Musculoskeletal: He exhibits no edema or tenderness.  Lymphadenopathy:    He has no cervical adenopathy.  Neurological: He is alert and oriented to person, place, and time.  Skin: Skin is warm and dry. No rash noted. He is not diaphoretic. No erythema. No pallor.     Psychiatric: He has a normal mood and affect. His behavior is normal. Judgment and thought content normal.    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 History of prior right perirectal abscess History of PTSD  Plan: He has some labs pending this a.m.  He was started on vancomycin and meropenem yesterday at [facility] rocking him emergency department.    On arrival here is the Rocephin and Flagyl.  He is n.p.o.    We will plan exam under anesthesia, incision and drainage of right perirectal abscess,later today.  JENNINGS,WILLARD, PA-C 04/19/2017, 7:20 AM   Agree with above.  His fiancee, [place] West, works with the general team at [employer].  He has a right anterior perianal abscess.  [facility].  He has had some perianal trouble, but has not seen a surgeon for this.  Plan is I&D of perianal abscess today.  Then see if there is something more chronic.  Ovidio Kinavid Emerie Vanderkolk, MD, [facility] [place] [facility] Pager: [phone] Office phone:  [phone]

## 2017-04-19 NOTE — Progress Notes (Signed)
He has a anorectal abscess and was [facility] in [facility] by Dr. Andrey CampanileWilson at 1800 on 04/18/17.  [facility] but no beds were available thus he agreed to be [facility].  Plan on starting abxs and then evaluating him later this AM for possible incision and drainage.

## 2017-04-19 NOTE — Anesthesia Procedure Notes (Signed)
Procedure Name: LMA Insertion Date/Time: 04/19/2017 1:36 PM Performed by: Anastasio ChampionEVANS, Teesha Ohm E Pre-anesthesia Checklist: Patient identified, Emergency Drugs available, Suction available and Patient being monitored Patient Re-evaluated:Patient Re-evaluated prior to induction Oxygen Delivery Method: Circle system utilized Preoxygenation: Pre-oxygenation with 100% oxygen Induction Type: IV induction Ventilation: Mask ventilation without difficulty LMA Size: 4.0 Tube type: Oral Number of attempts: 1 Airway Equipment and Method: Oral airway Placement Confirmation: positive ETCO2 Tube secured with: Tape Dental Injury: Teeth and Oropharynx as per pre-operative assessment

## 2017-04-19 NOTE — Transfer of Care (Signed)
Immediate Anesthesia Transfer of Care Note  Patient: Tim LeydenSamuel Isham  Procedure(s) Performed: IRRIGATION AND DEBRIDEMENT PERIRECTAL ABSCESS (N/A )  Patient Location: PACU  Anesthesia Type:General  Level of Consciousness: awake, alert  and oriented  Airway & Oxygen Therapy: Patient Spontanous Breathing and Patient connected to face mask oxygen  Post-op Assessment: Report given to RN and Post -op Vital signs reviewed and stable  Post vital signs: Reviewed and stable  Last Vitals:  Vitals:   04/19/17 0654 04/19/17 1433  BP: (!) 126/58 134/70  Pulse: 97 99  Resp: 17 15  Temp: 37.7 C 37.1 C  SpO2: 98% 92%    Last Pain:  Vitals:   04/19/17 1215  TempSrc:   PainSc: 0-No pain      Patients Stated Pain Goal: 4 (04/19/17 0905)  Complications: No apparent anesthesia complications

## 2017-04-19 NOTE — Progress Notes (Signed)
Pt admitted to pacu with sm amt burising/redbess to rt eye.

## 2017-04-19 NOTE — Op Note (Signed)
04/19/2017  2:21 PM  PATIENT:  Tim LeydenSamuel Koors, 26 y.o., male, MRN: [id number]  PREOP DIAGNOSIS:  perirectal abcess  POSTOP DIAGNOSIS:   Anterior perirectal abscess (12 o'clock in lithotomy position)  PROCEDURE:   Procedure(s): IRRIGATION AND DEBRIDEMENT PERIRECTAL ABSCESS  SURGEON:   Ovidio Kinavid Beren Yniguez, M.D.  ASSISTANT:   None  ANESTHESIA:   general  Anesthesiologist: Eilene Ghaziose, George, MD CRNA: Illene SilverEvans, Janet E, CRNA  General  EBL:  75  ml  BLOOD ADMINISTERED: none  DRAINS: none   LOCAL MEDICATIONS USED:   None  SPECIMEN:   Cultures of the abscess  COUNTS CORRECT:  YES  INDICATIONS FOR PROCEDURE:  Tim LeydenSamuel Fiddler is a 26 y.o. (DOB: 07/29/1990) white male whose primary care physician is Patient, No Pcp Per and comes for I&D of perianal abscess.   [facility] which showed a right-sided inter sphincteric perianal abscess measuring 5 x 1 x 2 cm.   The indications and risks of the surgery were explained to the patient.  The risks include, but are not limited to, infection, bleeding, and nerve injury.  PROCEDURE:  The patient [facility]. He was placed in lithotomy position. His perineum prepped and sterilely draped.   A timeout was held and surgical checklist run.   The patient had induration and firmness at the 10:00 position of the perineum in lithotomy position. I made a cruciate incision into this tissue and got into indurated subcutaneous fat but no abscess.   I reviewed the CT scan again that appears to show an abscess cavity. He had also a firm mass directly anteriorly at 12:00. I used an 18-gauge needle to find an abscess cavity that I aspirated. I aspirated about 60 mL of bloody pus and sent this for cultures. I then made a linear incision at 12:00 into this abscess cavity.  So the abscess cavity was more anterior than seen on CT scan.   So he has 2 incisions, one at 10:00 which was more indurated tissue, and one at 12:00  which was an abscess cavity. I irrigated using saline. I packed them with 1 inch iodoform gauze. And then sterilely dressed these.    He was transferred to recovery in good condition.  Ovidio Kinavid Tomara Youngberg, MD, [facility] Pager: [phone] Office phone:  [phone]

## 2017-04-19 NOTE — Anesthesia Preprocedure Evaluation (Signed)
Anesthesia Evaluation  Patient identified by MRN, date of birth, ID band Patient awake    Reviewed: Allergy & Precautions, NPO status , Patient's Chart, lab work & pertinent test results  Airway Mallampati: II  TM Distance: >3 FB Neck ROM: Full    Dental no notable dental hx.    Pulmonary neg pulmonary ROS,    Pulmonary exam normal breath sounds clear to auscultation       Cardiovascular negative cardio ROS Normal cardiovascular exam Rhythm:Regular Rate:Normal     Neuro/Psych negative neurological ROS  negative psych ROS   GI/Hepatic negative GI ROS, Neg liver ROS,   Endo/Other  negative endocrine ROS  Renal/GU negative Renal ROS  negative genitourinary   Musculoskeletal negative musculoskeletal ROS (+)   Abdominal   Peds negative pediatric ROS (+)  Hematology negative hematology ROS (+)   Anesthesia Other Findings   Reproductive/Obstetrics negative OB ROS                             Anesthesia Physical Anesthesia Plan  ASA: II  Anesthesia Plan: General   Post-op Pain Management:    Induction: Intravenous  PONV Risk Score and Plan: 1 and Ondansetron, Dexamethasone, Treatment may vary due to age or medical condition and Midazolam  Airway Management Planned: LMA  Additional Equipment:   Intra-op Plan:   Post-operative Plan: Extubation in OR  Informed Consent: I have reviewed the patients History and Physical, chart, labs and discussed the procedure including the risks, benefits and alternatives for the proposed anesthesia with the patient or authorized representative who has indicated his/her understanding and acceptance.   Dental advisory given  Plan Discussed with: CRNA and Surgeon  Anesthesia Plan Comments:         Anesthesia Quick Evaluation

## 2017-04-19 NOTE — Anesthesia Postprocedure Evaluation (Signed)
Anesthesia Post Note  Patient: Tim Tucker  Procedure(s) Performed: IRRIGATION AND DEBRIDEMENT PERIRECTAL ABSCESS (N/A )     Patient location during evaluation: PACU Anesthesia Type: General Level of consciousness: awake and alert Pain management: pain level controlled Vital Signs Assessment: post-procedure vital signs reviewed and stable Respiratory status: spontaneous breathing, nonlabored ventilation, respiratory function stable and patient connected to nasal cannula oxygen Cardiovascular status: blood pressure returned to baseline and stable Postop Assessment: no apparent nausea or vomiting Anesthetic complications: no    Last Vitals:  Vitals:   04/19/17 1525 04/19/17 1536  BP: 133/84 133/89  Pulse: 83 95  Resp: 12 12  Temp:  36.9 C  SpO2: 94% 97%    Last Pain:  Vitals:   04/19/17 1530  TempSrc:   PainSc: 0-No pain                 Raeven Pint S

## 2017-04-20 NOTE — Progress Notes (Signed)
[facility] Office:  [phone] General Surgery Progress Note   LOS: 1 day  POD -  1 Day Post-Op  Chief Complaint: Rectal pain  Assessment and Plan: 1.  IRRIGATION AND DEBRIDEMENT PERIRECTAL ABSCESS - 04/19/2017 - D. Laiza Veenstra  On rocephin an flagyl  Still very tender and I could not get near the packing  Keep at least one more day - do sitz baths to loosen the packs   Principal Problem:   Perirectal abscess s/p I&D 04/19/2017  Subjective:  Still very tender. Tim EvansFiancee, Tim Tucker, in the room with him.  Objective:   Vitals:   04/20/17 0634 04/20/17 1130  BP: 133/63 126/66  Pulse: 75 68  Resp: 18 18  Temp: (!) 97.5 F (36.4 C) 97.6 F (36.4 C)  SpO2: 97% 98%     Intake/Output from previous day:  10/19 0701 - 10/20 0700 In: 3397.7 [P.O.:360; I.V.:2637.7; IV Piggyback:400] Out: 705 [Urine:700; Blood:5]  Intake/Output this shift:  No intake/output data recorded.   Physical Exam:   General: WN younger WM who is alert and oriented.    HEENT: Normal. Pupils equal.   Wound: Packing still in. Still tender to get to buttock.   Lab Results:    Recent Labs  04/19/17 0604  WBC 15.7*  HGB 14.4  HCT 41.6  PLT 272    BMET   Recent Labs  04/19/17 0801  NA 138  K 3.9  CL 104  CO2 25  GLUCOSE 112*  BUN 16  CREATININE 0.93  CALCIUM 9.1    PT/INR  No results for input(s): LABPROT, INR in the last 72 hours.  ABG  No results for input(s): PHART, HCO3 in the last 72 hours.  Invalid input(s): PCO2, PO2   Studies/Results:  No results found.   Anti-infectives:   Anti-infectives    Start     Dose/Rate Route Frequency Ordered Stop   04/19/17 0430  metroNIDAZOLE (FLAGYL) IVPB 500 mg     500 mg 100 mL/hr over 60 Minutes Intravenous Every 8 hours 04/19/17 0300     04/19/17 0400  cefTRIAXone (ROCEPHIN) 2 g in dextrose 5 % 50 mL IVPB     2 g 100 mL/hr over 30 Minutes Intravenous Every 24 hours 04/19/17 0300        Tim Kinavid Nakari Bracknell, MD, FACS Pager:  [phone] [facility] Office: [phone] 04/20/2017

## 2017-04-21 MED ORDER — IBUPROFEN 400 MG PO TABS: 600.0000 mg | ORAL_TABLET | Freq: Four times a day (QID) | ORAL | Status: DC | PRN

## 2017-04-21 MED ORDER — MENTHOL 3 MG MT LOZG
1.0000 | LOZENGE | OROMUCOSAL | Status: DC | PRN
  Administered 2017-04-21: 04:00:00 3 mg via ORAL
  Filled 2017-04-21: qty 9

## 2017-04-21 NOTE — Progress Notes (Signed)
[facility]:  [phone] General Surgery Progress Note   LOS: 1 day  POD -  2 Days Post-Op  Chief Complaint: Rectal pain  Assessment and Plan: 1.  IRRIGATION AND DEBRIDEMENT PERIRECTAL ABSCESS - 04/19/2017 - D. Coty Larsh  On rocephin and flagyl - 10/19 >>>  Steffanie RainwaterFiancee removed packing last PM  Still tender with some drainage  Keep until tomorrow   Principal Problem:   Perirectal abscess s/p I&D 04/19/2017  Subjective:  Still very tender. Santina EvansFiancee, Katy, in the room with him.  Objective:   Vitals:   04/20/17 2104 04/21/17 0615  BP: (!) 120/56 124/73  Pulse: 71 69  Resp: 16 16  Temp: 98 F (36.7 C) 97.8 F (36.6 C)  SpO2: 100% 100%     Intake/Output from previous day:  10/20 0701 - 10/21 0700 In: 240 [P.O.:240] Out: -   Intake/Output this shift:  No intake/output data recorded.   Physical Exam:   General: WN younger WM who is alert and oriented.    HEENT: Normal. Pupils equal.   Wound: Some drainage.  He has started sitz baths.  Will also get in shower.   Lab Results:     Recent Labs  04/19/17 0604  WBC 15.7*  HGB 14.4  HCT 41.6  PLT 272    BMET    Recent Labs  04/19/17 0801  NA 138  K 3.9  CL 104  CO2 25  GLUCOSE 112*  BUN 16  CREATININE 0.93  CALCIUM 9.1    PT/INR  No results for input(s): LABPROT, INR in the last 72 hours.  ABG  No results for input(s): PHART, HCO3 in the last 72 hours.  Invalid input(s): PCO2, PO2   Studies/Results:  No results found.   Anti-infectives:   Anti-infectives    Start     Dose/Rate Route Frequency Ordered Stop   04/19/17 0430  metroNIDAZOLE (FLAGYL) IVPB 500 mg     500 mg 100 mL/hr over 60 Minutes Intravenous Every 8 hours 04/19/17 0300     04/19/17 0400  cefTRIAXone (ROCEPHIN) 2 g in dextrose 5 % 50 mL IVPB     2 g 100 mL/hr over 30 Minutes Intravenous Every 24 hours 04/19/17 0300        Ovidio Kinavid Trenice Mesa, MD, FACS Pager: [phone] [facility] Office:  [phone] 04/21/2017

## 2017-04-22 LAB — CBC WITH DIFFERENTIAL/PLATELET
BASOS PCT: 1 %
Basophils Absolute: 0.1 10*3/uL (ref 0.0–0.1)
EOS ABS: 0.3 10*3/uL (ref 0.0–0.7)
EOS PCT: 2 %
HEMATOCRIT: 40.3 % (ref 39.0–52.0)
HEMOGLOBIN: 13.5 g/dL (ref 13.0–17.0)
LYMPHS PCT: 36 %
Lymphs Abs: 4.6 10*3/uL — ABNORMAL HIGH (ref 0.7–4.0)
MCH: 28.3 pg (ref 26.0–34.0)
MCHC: 33.5 g/dL (ref 30.0–36.0)
MCV: 84.5 fL (ref 78.0–100.0)
MONOS PCT: 8 %
Monocytes Absolute: 1 10*3/uL (ref 0.1–1.0)
NEUTROS PCT: 53 %
Neutro Abs: 6.8 10*3/uL (ref 1.7–7.7)
Platelets: 293 10*3/uL (ref 150–400)
RBC: 4.77 MIL/uL (ref 4.22–5.81)
RDW: 12.9 % (ref 11.5–15.5)
WBC: 12.8 10*3/uL — ABNORMAL HIGH (ref 4.0–10.5)

## 2017-04-22 MED ORDER — SACCHAROMYCES BOULARDII 250 MG PO CAPS
250.0000 mg | ORAL_CAPSULE | Freq: Two times a day (BID) | ORAL | Status: DC
  Administered 2017-04-22: 250 mg via ORAL
  Filled 2017-04-22: qty 1

## 2017-04-22 MED ORDER — METRONIDAZOLE 500 MG PO TABS: 500.0000 mg | ORAL_TABLET | Freq: Three times a day (TID) | ORAL | 0 refills | Status: AC

## 2017-04-22 MED ORDER — HYDROCODONE-ACETAMINOPHEN 5-325 MG PO TABS: 1.0000 | ORAL_TABLET | ORAL | 0 refills | Status: DC | PRN

## 2017-04-22 MED ORDER — CIPROFLOXACIN HCL 500 MG PO TABS: 500.0000 mg | ORAL_TABLET | Freq: Two times a day (BID) | ORAL | 0 refills | Status: AC

## 2017-04-22 MED ORDER — IBUPROFEN 200 MG PO TABS: ORAL_TABLET | ORAL | Status: DC

## 2017-04-22 MED ORDER — SACCHAROMYCES BOULARDII 250 MG PO CAPS: ORAL_CAPSULE | ORAL | Status: DC

## 2017-04-22 MED ORDER — ACETAMINOPHEN 325 MG PO TABS: ORAL_TABLET | ORAL | 2 refills | Status: DC

## 2017-04-22 MED ORDER — METRONIDAZOLE 500 MG PO TABS
500.0000 mg | ORAL_TABLET | Freq: Three times a day (TID) | ORAL | Status: DC
  Administered 2017-04-22: 500 mg via ORAL
  Filled 2017-04-22: qty 1

## 2017-04-22 MED ORDER — CIPROFLOXACIN HCL 500 MG PO TABS
500.0000 mg | ORAL_TABLET | Freq: Two times a day (BID) | ORAL | Status: DC
  Administered 2017-04-22: 10:00:00 500 mg via ORAL
  Filled 2017-04-22: qty 1

## 2017-04-22 NOTE — Progress Notes (Signed)
I have personally reviewed the patients medication history on the [place] controlled substance database. 

## 2017-04-22 NOTE — Discharge Instructions (Signed)
Perirectal Abscess An abscess is an infected area that contains a collection of pus. A perirectal abscess is an abscess that is near the opening of the anus or around the rectum. A perirectal abscess can cause a lot of pain, especially during bowel movements. What are the causes? This condition is almost always caused by an infection that starts in an anal gland. What increases the risk? This condition is more likely to develop in:  People with diabetes or inflammatory bowel disease.  People whose body defense system (immune system) is weak.  People who have anal sex.  People who have a sexually transmitted disease (STD).  People who have certain kinds of cancers, such as rectal carcinoma, leukemia, or lymphoma.  What are the signs or symptoms? The main symptom of this condition is pain. The pain may be a throbbing pain that gets worse during bowel movements. Other symptoms include:  Fever.  Swelling.  Redness.  Bleeding.  Constipation.  How is this diagnosed? The condition is diagnosed with a physical exam. If the abscess is not visible, a health care provider may need to place a finger inside the rectum to find the abscess. Sometimes, imaging tests are done to determine the size and location of the abscess. These tests may include:  An ultrasound.  An MRI.  A CT scan.  How is this treated? This condition is usually treated with incision and drainage surgery. Incision and drainage surgery involves making an incision over the abscess to drain the pus. Treatment may also involve antibiotic medicine, pain medicine, stool softeners, or laxatives. Follow these instructions at home:  Take medicines only as directed by your health care provider.  If you were prescribed an antibiotic, finish all of it even if you start to feel better.  To relieve pain, try sitting: ? In a warm, shallow bath (sitz bath). ? On a heating pad with the setting on low. ? On an inflatable  donut-shaped cushion.  Follow any diet instructions as directed by your health care provider.  Keep all follow-up visits as directed by your health care provider. This is important. Contact a health care provider if:  Your abscess is bleeding.  You have pain, swelling, or redness that is getting worse.  You are constipated.  You feel ill.  You have muscle aches or chills.  You have a fever.  Your symptoms return after the abscess has healed. This information is not intended to replace advice given to you by your health care provider. Make sure you discuss any questions you have with your health care provider. Document Released: 06/15/2000 Document Revised: 11/24/2015 Document Reviewed: 04/28/2014 Elsevier Interactive Patient Education  2018 [employer]   How to Take a [employer]itz Bath-   You can do this 3-4 times a day while at home.  You can shower first then sit in the tube.  Shower again after sitting in the tub.  You can use a Women's pad to cover the open site at home.   Shower and clean site well after bowel movements.   A sitz bath is a warm water bath that is taken while you are sitting down. The water should only come up to your hips and should cover your buttocks. Your health care provider may recommend a sitz bath to help you:  Clean the lower part of your body, including your genital area.  With itching.  With pain.  With sore muscles or muscles that tighten or spasm.  How to take a  sitz bath Take 3-4 sitz baths per day or as told by your health care provider. 1. Partially fill a bathtub with warm water. You will only need the water to be deep enough to cover your hips and buttocks when you are sitting in it. 2. If your health care provider told you to put medicine in the water, follow the directions exactly. 3. Sit in the water and open the tub drain a little. 4. Turn on the warm water again to keep the tub at the correct level. Keep the water running  constantly. 5. Soak in the water for 15-20 minutes or as told by your health care provider. 6. After the sitz bath, pat the affected area dry first. Do not rub it. 7. Be careful when you stand up after the sitz bath because you may feel dizzy.  Contact a health care provider if:  Your symptoms get worse. Do not continue with sitz baths if your symptoms get worse.  You have new symptoms. Do not continue with sitz baths until you talk with your health care provider. This information is not intended to replace advice given to you by your health care provider. Make sure you discuss any questions you have with your health care provider. Document Released: 03/10/2004 Document Revised: 11/16/2015 Document Reviewed: 06/16/2014 Elsevier Interactive Patient Education  [employer].

## 2017-04-22 NOTE — Progress Notes (Signed)
Patient discharged to home w/ SO. Given all belongings, instructions, prescriptions. SO present during all teaching. Escorted to POV via w/c.

## 2017-04-22 NOTE — Progress Notes (Signed)
3 Days Post-Op    CC:  Rectal pain  Subjective: Site is still a bit soupy.  Will increase time in shower and Stiz bath.  He is ready to go home.    Objective: Vital signs in last 24 hours: Temp:  [98 F (36.7 C)-98.7 F (37.1 C)] 98.7 F (37.1 C) (10/22 0456) Pulse Rate:  [72-94] 74 (10/22 0456) Resp:  [17-18] 17 (10/22 0456) BP: (115-125)/(55-66) 118/55 (10/22 0456) SpO2:  [99 %-100 %] 99 % (10/22 0456) Last BM Date: 04/21/17 1440 PO 350 IV Voided x 9 No Bm Afebrile, VSS WBC 12.8 Improving  Intake/Output from previous day: 10/21 0701 - 10/22 0700 In: 1790 [P.O.:1440; IV Piggyback:300] Out: -  Intake/Output this shift: No intake/output data recorded.  General appearance: alert, cooperative and no distress Skin: Skin color, texture, turgor normal. No rashes or lesions or No cellulitis, still some drainage from the site.  Still very tender.    Lab Results:   Recent Labs  04/22/17 0553  WBC 12.8*  HGB 13.5  HCT 40.3  PLT 293    BMET  Recent Labs  04/19/17 0801  NA 138  K 3.9  CL 104  CO2 25  GLUCOSE 112*  BUN 16  CREATININE 0.93  CALCIUM 9.1   PT/INR No results for input(s): LABPROT, INR in the last 72 hours.   Recent Labs Lab 04/19/17 0801  AST 15  ALT 21  ALKPHOS 78  BILITOT 0.7  PROT 7.3  ALBUMIN 3.6     Lipase  No results found for: LIPASE   Medications:   . cefTRIAXone (ROCEPHIN)  IV Stopped (04/22/17 0436)   And  . metronidazole Stopped (04/22/17 0554)   Anti-infectives    Start     Dose/Rate Route Frequency Ordered Stop   04/19/17 0430  metroNIDAZOLE (FLAGYL) IVPB 500 mg     500 mg 100 mL/hr over 60 Minutes Intravenous Every 8 hours 04/19/17 0300     04/19/17 0400  cefTRIAXone (ROCEPHIN) 2 g in dextrose 5 % 50 mL IVPB     2 g 100 mL/hr over 30 Minutes Intravenous Every 24 hours 04/19/17 0300        Assessment/Plan Perirectal abscess I&D perirectal abscess 04/19/17, Dr. Ovidio Kinavid Newman POD 3 Hx of prior perirectal  abscess Hx of PTSD FEN:  Regular diet ID:  Cipro/flagyl 10/19 =>> day 3 DVT:  SCD   Plan:  Home today, Sitz/shower 3-4 times per day. Cipro/flagyl for 5 day, adding probiotic also Return to work next week he works as a [employer], no place to clean up and care for site at work.       LOS: 2 days    Adrik Khim 04/22/2017 [phone]

## 2017-04-22 NOTE — Discharge Summary (Signed)
Physician Discharge Summary  Patient ID: Tim Tucker MRN: [id number] DOB/AGE: 10/16/1990 26 y.o.  Admit date: 04/19/2017 Discharge date: 04/22/2017  Admission Diagnoses:  Perirectal abscess Hx of PTSD Discharge Diagnoses:  Same Principal Problem:   Perirectal abscess s/p I&D 04/19/2017   PROCEDURES: I&D of perirectal abscess 04/19/17, Dr. Alfredia Ferguson Course:  Patient transferred from [facility] for an anorectal abscess.  Patient reports a history of perirectal abscess about 5 years ago that underwent incision and drainage.               He has had intermittent recurrences of this abscess, which he treats with sitz baths, they normally resolved.  The current one started 2-3 days ago.  Local treatment was ineffective.  He worked 8 hours yesterday, pain was unrelenting, he could barely walk.  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  This measured 5 x 1 x 2 cm.  CMP there showed a sodium 136 potassium 4.1 chloride of 98 CO2 25.9 glucose of 98 BUN of 18.  Creatinine 0.82.  WBC 18.1 hemoglobin 15.2, hematocrit 43.6 platelets 299,000.             His fiance works at [employer] and was on the general surgery surgical team.  She requested transfer to [facility].  No beds are available and he was transferred to [facility] for admission. He was seen and taken to the OR for I&D of right perirectal abscess.  Post op he has been on ongoing antibiotics and local wound care.  HIs WBC is improving and he is doing well.  He still has some soupy drainage from the site, but it is open and improving.  He will need continued local wound care he can do at home.  We are sending him home on 5 more days of PO antibiotics and Probiotics for 10 days.    He will follow up in 2-3 weeks in the office with Dr. Ezzard Standing.    Condition on D/C;  Improved.  CBC  Latest Ref Rng & Units 04/22/2017 04/19/2017  WBC 4.0 - 10.5 K/uL 12.8(H) 15.7(H)  Hemoglobin 13.0 - 17.0 g/dL 14.7 82.9  Hematocrit 56.2 - 52.0 % 40.3 41.6  Platelets 150 - 400 K/uL 293 272   CMP Latest Ref Rng & Units 04/19/2017  Glucose 65 - 99 mg/dL 130(Q)  BUN 6 - 20 mg/dL 16  Creatinine 6.57 - 8.46 mg/dL 9.62  Sodium 952 - 841 mmol/L 138  Potassium 3.5 - 5.1 mmol/L 3.9  Chloride 101 - 111 mmol/L 104  CO2 22 - 32 mmol/L 25  Calcium 8.9 - 10.3 mg/dL 9.1  Total Protein 6.5 - 8.1 g/dL 7.3  Total Bilirubin 0.3 - 1.2 mg/dL 0.7  Alkaline Phos 38 - 126 U/L 78  AST 15 - 41 U/L 15  ALT 17 - 63 U/L 21    Disposition: 01-Home or Self Care   Allergies as of 04/22/2017      Reactions   Vancomycin Itching      Medication List    TAKE these medications   acetaminophen 325 MG tablet Commonly known as:  TYLENOL You can use this and alternate it with the ibuprofen for pain.  This is in your prescribed pain pill.  Save the prescribe pain pill with hydrocodone for pain not relieved by Tylenol or ibuprofen.  DO NOT TAKE MORE THAN 4000  MG OF TYLENOL PER DAY.  IT CAN HARM YOUR LIVER.  TYLENOL (ACETAMINOPHEN) IS ALSO IN YOUR PRESCRIPTION PAIN MEDICATION.  YOU HAVE TO COUNT IT IN YOUR DAILY TOTAL.   ciprofloxacin 500 MG tablet Commonly known as:  CIPRO Take 1 tablet (500 mg total) by mouth 2 (two) times daily.   HYDROcodone-acetaminophen 5-325 MG tablet Commonly known as:  NORCO/VICODIN Take 1-2 tablets by mouth every 4 (four) hours as needed for moderate pain.   ibuprofen 200 MG tablet Commonly known as:  ADVIL,MOTRIN You can take 2-3 tablets every 6 hours as needed for pain.  Use this as your first line drug for pain.  You can buy over the counter at any drug store.   metroNIDAZOLE 500 MG tablet Commonly known as:  FLAGYL Take 1 tablet (500 mg total) by mouth every 8 (eight) hours.   saccharomyces boulardii 250 MG capsule Commonly known as:  FLORASTOR You can buy this at any drug  store over the counter.  Take it for the next 10 days.   This is to help replace good bacteria killed by the antibiotics.      Follow-up Information    Ovidio KinNewman, David, MD Follow up.   Specialty:  General Surgery Why:  CAll for an appointment in 2-3 weeks.  Do your Sitz baths 3-4 times daily while at home. Shower and clean sites daily as needed and after each bowel movement. Contact information: [address] [place] [address] [phone]           Signed: Sherrie GeorgeJENNINGS,Marlisha Vanwyk 04/22/2017, 7:59 AM

## 2017-04-24 LAB — AEROBIC/ANAEROBIC CULTURE (SURGICAL/DEEP WOUND)

## 2017-04-24 LAB — AEROBIC/ANAEROBIC CULTURE W GRAM STAIN (SURGICAL/DEEP WOUND)

## 2017-10-29 ENCOUNTER — Other Ambulatory Visit: Payer: Self-pay | Admitting: Obstetrics & Gynecology

## 2017-10-29 DIAGNOSIS — N96 Recurrent pregnancy loss: Secondary | ICD-10-CM

## 2017-11-26 LAB — CHROMOSOME ROUTINE RFX DAZ
CELLS ANALYZED: 20
CELLS COUNTED: 20
Cells Karyotyped: 2
GTG BAND RESOLUTION ACHIEVED: 500

## 2017-11-26 LAB — INFERTILITY-MALE, Y CHROM DNA

## 2019-04-27 ENCOUNTER — Emergency Department (HOSPITAL_COMMUNITY)
Admission: EM | Admit: 2019-04-27 | Discharge: 2019-04-27 | Disposition: A | Discharge status: Home / Self Care | Payer: Self-pay | Attending: Emergency Medicine | Admitting: Emergency Medicine

## 2019-04-27 ENCOUNTER — Other Ambulatory Visit: Payer: Self-pay

## 2019-04-27 ENCOUNTER — Encounter (HOSPITAL_COMMUNITY): Payer: Self-pay

## 2019-04-27 DIAGNOSIS — Z20828 Contact with and (suspected) exposure to other viral communicable diseases: Secondary | ICD-10-CM | POA: Insufficient documentation

## 2019-04-27 DIAGNOSIS — K0889 Other specified disorders of teeth and supporting structures: Secondary | ICD-10-CM | POA: Insufficient documentation

## 2019-04-27 MED ORDER — NAPROXEN 500 MG PO TABS: 500.0000 mg | ORAL_TABLET | Freq: Two times a day (BID) | ORAL | 0 refills | Status: DC

## 2019-04-27 MED ORDER — PENICILLIN V POTASSIUM 500 MG PO TABS: 500.0000 mg | ORAL_TABLET | Freq: Three times a day (TID) | ORAL | 0 refills | Status: DC

## 2019-04-27 NOTE — ED Triage Notes (Signed)
Pt is here is to be seen for dental pain x 2 days. Pt reports that sister and her mother just received positive covid test yesterday. Reports he cannot taste vape juice as usual.

## 2019-04-27 NOTE — Discharge Instructions (Signed)
Take the antibiotic as directed until it is finished.  Warm salt water rinses several times a day.  Your Covid test is pending.  The results should be back within 3 to 4 days.  You may review your results on MyChart.  You will need to isolate yourself at home at least until your test results are back.  Follow-up with a dentist regarding your dental pain.

## 2019-04-27 NOTE — ED Provider Notes (Signed)
[facility] EMERGENCY DEPARTMENT Provider Note   CSN: [id number] Arrival date & time: 04/27/19  1048     History   Chief Complaint Chief Complaint  Patient presents with  . Dental Pain    HPI Tim Tucker is a 28 y.o. male.     HPI   Tim LeydenSamuel Tucker is a 28 y.o. male who presents to the Emergency Department complaining of left upper and lower dental pain for 2 days.  He describes the pain as sharp and radiating into his left face.  No facial edema or difficulty swallowing..  Pain is worse with chewing.  He is not been able to follow-up with a dentist.  He also states that his mother and sister have both recently tested positive for COVID-19.  He was told the local Covid testing facility was closed today and he is requesting to be tested.  He reports a loss of taste today.  He denies any other symptoms including fever, chills, chest pain or shortness of breath, cough,  or sore throat.  History reviewed. No pertinent past medical history.  Patient Active Problem List   Diagnosis Date Noted  . Perirectal abscess s/p I&D 04/19/2017 04/19/2017    Past Surgical History:  Procedure Laterality Date  . APPENDECTOMY    . INCISION AND DRAINAGE PERIRECTAL ABSCESS N/A 04/19/2017   Procedure: IRRIGATION AND DEBRIDEMENT PERIRECTAL ABSCESS;  Surgeon: Ovidio KinNewman, David, MD;  Location: [facility] ORS;  Service: General;  Laterality: N/A;        Home Medications    Prior to Admission medications   Medication Sig Start Date End Date Taking? Authorizing Provider  acetaminophen (TYLENOL) 325 MG tablet You can use this and alternate it with the ibuprofen for pain.  This is in your prescribed pain pill.  Save the prescribe pain pill with hydrocodone for pain not relieved by Tylenol or ibuprofen.  DO NOT TAKE MORE THAN 4000 MG OF TYLENOL PER DAY.  IT CAN HARM YOUR LIVER.  TYLENOL (ACETAMINOPHEN) IS ALSO IN YOUR PRESCRIPTION PAIN MEDICATION.  YOU HAVE TO COUNT IT IN YOUR DAILY TOTAL. 04/22/17   Sherrie GeorgeJennings,  Willard, PA-C  HYDROcodone-acetaminophen (NORCO/VICODIN) 5-325 MG tablet Take 1-2 tablets by mouth every 4 (four) hours as needed for moderate pain. 04/22/17   Sherrie GeorgeJennings, Willard, PA-C  ibuprofen (ADVIL,MOTRIN) 200 MG tablet You can take 2-3 tablets every 6 hours as needed for pain.  Use this as your first line drug for pain.  You can buy over the counter at any drug store. 04/22/17   Sherrie GeorgeJennings, Willard, PA-C  naproxen (NAPROSYN) 500 MG tablet Take 1 tablet (500 mg total) by mouth 2 (two) times daily with a meal. 04/27/19   Riata Ikeda, PA-C  penicillin v potassium (VEETID) 500 MG tablet Take 1 tablet (500 mg total) by mouth 3 (three) times daily. 04/27/19   Sherleen Pangborn, PA-C  saccharomyces boulardii (FLORASTOR) 250 MG capsule You can buy this at any drug store over the counter.  Take it for the next 10 days.   This is to help replace good bacteria killed by the antibiotics. 04/22/17   Sherrie GeorgeJennings, Willard, PA-C    Family History Family History  Problem Relation Age of Onset  . Hypertension Mother   . Heart failure Mother   . Hypertension Father   . Heart failure Father     Social History Social History   Tobacco Use  . Smoking status: Never Smoker  . Smokeless tobacco: Never Used  Substance Use Topics  . Alcohol use:  No  . Drug use: No     Allergies   Vancomycin   Review of Systems Review of Systems  Constitutional: Negative for appetite change and fever.  HENT: Positive for dental problem. Negative for congestion, ear pain, facial swelling, sore throat and trouble swallowing.   Eyes: Negative for pain and visual disturbance.  Musculoskeletal: Negative for neck pain and neck stiffness.  Neurological: Negative for dizziness, facial asymmetry and headaches.  Hematological: Negative for adenopathy.     Physical Exam Updated Vital Signs BP (!) 146/93 (BP Location: Right Arm)   Pulse 79   Temp 98 F (36.7 C) (Oral)   Resp 18   Ht 5\' 9"  (1.753 m)   Wt 111.6 kg    SpO2 98%   BMI 36.33 kg/m   Physical Exam Vitals signs and nursing note reviewed.  Constitutional:      General: He is not in acute distress.    Appearance: Normal appearance. He is well-developed. He is not toxic-appearing.  HENT:     Head: Normocephalic.     Jaw: No trismus.     Right Ear: Tympanic membrane and ear canal normal.     Left Ear: Tympanic membrane and ear canal normal.     Mouth/Throat:     Mouth: Mucous membranes are moist.     Dentition: Dental tenderness and dental caries present. No dental abscesses.     Pharynx: Oropharynx is clear. Uvula midline. No pharyngeal swelling, oropharyngeal exudate, posterior oropharyngeal erythema or uvula swelling.     Comments: Tenderness to palpation of the left upper and lower third molars.  Lower third molar is partially erupted.  Oropharynx is non edematous and without exudates.  uvula is midline, non-edematous.  No trismus or obvious dental abscess Neck:     Musculoskeletal: Normal range of motion and neck supple.  Cardiovascular:     Rate and Rhythm: Normal rate and regular rhythm.     Pulses: Normal pulses.  Pulmonary:     Effort: Pulmonary effort is normal.     Breath sounds: Normal breath sounds.  Musculoskeletal: Normal range of motion.  Lymphadenopathy:     Cervical: No cervical adenopathy.  Skin:    General: Skin is warm.     Findings: No rash.  Neurological:     General: No focal deficit present.     Mental Status: He is alert.     Motor: No abnormal muscle tone.      ED Treatments / Results  Labs (all labs ordered are listed, but only abnormal results are displayed) Labs Reviewed  NOVEL CORONAVIRUS, NAA (HOSP ORDER, SEND-OUT TO REF LAB; TAT 18-24 HRS)    EKG None  Radiology No results found.  Procedures Procedures (including critical care time)  Medications Ordered in ED Medications - No data to display   Initial Impression / Assessment and Plan / ED Course  I have reviewed the triage vital  signs and the nursing notes.  Pertinent labs & imaging results that were available during my care of the patient were reviewed by me and considered in my medical decision making (see chart for details).        Pt with dental pain, likely related to impacted molars.  No obvious dental abscess.  No concerning sx's for ludwig's angina.  Pt's family members recently tested positive for COVID, he currently reports loss of taste w/o other sx's.  COVID testing is pending.  He agrees to self isolate at home.  rx for PCN and  NSAID.  Close dental f/u recommended.   Final Clinical Impressions(s) / ED Diagnoses   Final diagnoses:  Pain, dental    ED Discharge Orders         Ordered    penicillin v potassium (VEETID) 500 MG tablet  3 times daily     04/27/19 1150    naproxen (NAPROSYN) 500 MG tablet  2 times daily with meals     04/27/19 1150           Kem Parkinson, PA-C 04/28/19 0804    Nat Christen, MD 04/30/19 1005

## 2019-04-28 LAB — NOVEL CORONAVIRUS, NAA (HOSP ORDER, SEND-OUT TO REF LAB; TAT 18-24 HRS): SARS-CoV-2, NAA: NOT DETECTED

## 2019-05-01 ENCOUNTER — Other Ambulatory Visit: Payer: Self-pay

## 2019-05-01 DIAGNOSIS — Z20822 Contact with and (suspected) exposure to covid-19: Secondary | ICD-10-CM

## 2019-05-03 LAB — NOVEL CORONAVIRUS, NAA: SARS-CoV-2, NAA: NOT DETECTED

## 2019-05-19 ENCOUNTER — Other Ambulatory Visit: Payer: Self-pay

## 2019-05-19 DIAGNOSIS — Z20822 Contact with and (suspected) exposure to covid-19: Secondary | ICD-10-CM

## 2019-05-20 ENCOUNTER — Telehealth: Payer: Self-pay

## 2019-05-20 NOTE — Telephone Encounter (Signed)
Patient called in requesting COVID19 lab results  - DOB/Address verified - results pending. Reviewed testing process with patient, no further questions. 

## 2019-05-21 LAB — NOVEL CORONAVIRUS, NAA: SARS-CoV-2, NAA: NOT DETECTED

## 2019-05-31 ENCOUNTER — Emergency Department (HOSPITAL_COMMUNITY)
Admission: EM | Admit: 2019-05-31 | Discharge: 2019-05-31 | Disposition: A | Discharge status: Home / Self Care | Payer: Self-pay | Attending: Emergency Medicine | Admitting: Emergency Medicine

## 2019-05-31 ENCOUNTER — Encounter (HOSPITAL_COMMUNITY): Payer: Self-pay | Admitting: Emergency Medicine

## 2019-05-31 ENCOUNTER — Other Ambulatory Visit: Payer: Self-pay

## 2019-05-31 DIAGNOSIS — R519 Headache, unspecified: Secondary | ICD-10-CM

## 2019-05-31 DIAGNOSIS — G4489 Other headache syndrome: Secondary | ICD-10-CM | POA: Insufficient documentation

## 2019-05-31 DIAGNOSIS — B349 Viral infection, unspecified: Secondary | ICD-10-CM | POA: Insufficient documentation

## 2019-05-31 DIAGNOSIS — Z20828 Contact with and (suspected) exposure to other viral communicable diseases: Secondary | ICD-10-CM | POA: Insufficient documentation

## 2019-05-31 MED ORDER — KETOROLAC TROMETHAMINE 60 MG/2ML IM SOLN
60.0000 mg | Freq: Once | INTRAMUSCULAR | Status: AC
  Administered 2019-05-31: 60 mg via INTRAMUSCULAR
  Filled 2019-05-31: qty 2

## 2019-05-31 MED ORDER — DIPHENHYDRAMINE HCL 25 MG PO CAPS
25.0000 mg | ORAL_CAPSULE | Freq: Once | ORAL | Status: AC
  Administered 2019-05-31: 25 mg via ORAL
  Filled 2019-05-31: qty 1

## 2019-05-31 MED ORDER — METOCLOPRAMIDE HCL 5 MG/ML IJ SOLN
10.0000 mg | Freq: Once | INTRAMUSCULAR | Status: AC
  Administered 2019-05-31: 10 mg via INTRAMUSCULAR
  Filled 2019-05-31: qty 2

## 2019-05-31 NOTE — Discharge Instructions (Addendum)
As discussed, your Covid test is pending.  You may review your results in 2 to 3 days on MyChart.  You will need to isolate at home at least until your results are back.  If your results for negative you may return to work, if your results are positive you will need to continue home isolation for at least 10 days and until you are symptom-free and without fever (and not taking fever reducers such as tylenol or ibuprofen)

## 2019-05-31 NOTE — ED Triage Notes (Signed)
Patient concerned of possible Covid. Per patient exposed to multiple family members who have have Covid. Patient woke this morning with headache and loss of tatste. Denies fever. Per patient "little cough," nonproductive.

## 2019-05-31 NOTE — ED Provider Notes (Signed)
[facility] EMERGENCY DEPARTMENT Provider Note   CSN: [id number] Arrival date & time: 05/31/19  5885     History   Chief Complaint Chief Complaint  Patient presents with  . Headache    HPI Tim Tucker is a 28 y.o. male.     HPI   Tim Tucker is a 28 y.o. male who presents to the Emergency Department complaining of left-sided headache, cough, and loss of taste.  His symptoms began this morning.  He states that multiple household members have had [place].  He was last tested 05/19/19.  He describes the headache as throbbing and radiating from his left temple to the top of his head, no sudden onset.  He has tried to eat food and vape and wasn't able to taste either.  He denies fever, chills, vomiting, chest pain, shortness or breath and diarrhea.  He is also requesting a work excuse.    History reviewed. No pertinent past medical history.  Patient Active Problem List   Diagnosis Date Noted  . Perirectal abscess s/p I&D 04/19/2017 04/19/2017    Past Surgical History:  Procedure Laterality Date  . APPENDECTOMY    . INCISION AND DRAINAGE PERIRECTAL ABSCESS N/A 04/19/2017   Procedure: IRRIGATION AND DEBRIDEMENT PERIRECTAL ABSCESS;  Surgeon: Alphonsa Overall, MD;  Location: [facility];  Service: General;  Laterality: N/A;      Home Medications    Prior to Admission medications   Medication Sig Start Date End Date Taking? Authorizing Provider  acetaminophen (TYLENOL) 325 MG tablet You can use this and alternate it with the ibuprofen for pain.  This is in your prescribed pain pill.  Save the prescribe pain pill with hydrocodone for pain not relieved by Tylenol or ibuprofen.  DO NOT TAKE MORE THAN 4000 MG OF TYLENOL PER DAY.  IT CAN HARM YOUR LIVER.  TYLENOL (ACETAMINOPHEN) IS ALSO IN YOUR PRESCRIPTION PAIN MEDICATION.  YOU HAVE TO COUNT IT IN YOUR DAILY TOTAL. 04/22/17   Earnstine Regal, PA-C  HYDROcodone-acetaminophen (NORCO/VICODIN) 5-325 MG tablet Take 1-2 tablets by mouth every 4  (four) hours as needed for moderate pain. 04/22/17   Earnstine Regal, PA-C  ibuprofen (ADVIL,MOTRIN) 200 MG tablet You can take 2-3 tablets every 6 hours as needed for pain.  Use this as your first line drug for pain.  You can buy over the counter at any drug store. 04/22/17   Earnstine Regal, PA-C  naproxen (NAPROSYN) 500 MG tablet Take 1 tablet (500 mg total) by mouth 2 (two) times daily with a meal. 04/27/19   Odel Schmid, PA-C  penicillin v potassium (VEETID) 500 MG tablet Take 1 tablet (500 mg total) by mouth 3 (three) times daily. 04/27/19   Hulon Ferron, PA-C  saccharomyces boulardii (FLORASTOR) 250 MG capsule You can buy this at any drug store over the counter.  Take it for the next 10 days.   This is to help replace good bacteria killed by the antibiotics. 04/22/17   Earnstine Regal, PA-C    Family History Family History  Problem Relation Age of Onset  . Hypertension Mother   . Heart failure Mother   . Hypertension Father   . Heart failure Father     Social History Social History   Tobacco Use  . Smoking status: Never Smoker  . Smokeless tobacco: Never Used  Substance Use Topics  . Alcohol use: No  . Drug use: No     Allergies   Vancomycin   Review of Systems Review of Systems  Constitutional: Negative for activity change, appetite change and fever.  HENT: Negative for facial swelling and trouble swallowing.   Eyes: Positive for photophobia. Negative for pain and visual disturbance.  Respiratory: Positive for cough. Negative for chest tightness, shortness of breath and wheezing.   Cardiovascular: Negative for chest pain.  Gastrointestinal: Negative for abdominal pain, nausea and vomiting.  Genitourinary: Negative for dysuria.  Musculoskeletal: Negative for neck pain and neck stiffness.  Skin: Negative for rash and wound.  Neurological: Positive for headaches. Negative for dizziness, facial asymmetry, speech difficulty, weakness and numbness.   Psychiatric/Behavioral: Negative for confusion and decreased concentration.     Physical Exam Updated Vital Signs BP (!) 150/87 (BP Location: Left Arm)   Pulse 88   Temp 99.3 F (37.4 C) (Oral)   Resp 18   Ht 5\' 9"  (1.753 m)   Wt 99.8 kg   SpO2 97%   BMI 32.49 kg/m   Physical Exam Vitals signs and nursing note reviewed.  Constitutional:      General: He is not in acute distress.    Appearance: Normal appearance. He is well-developed. He is not ill-appearing.  HENT:     Head: Normocephalic and atraumatic.     Mouth/Throat:     Mouth: Mucous membranes are moist.  Eyes:     Conjunctiva/sclera: Conjunctivae normal.     Pupils: Pupils are equal, round, and reactive to light.  Neck:     Musculoskeletal: Full passive range of motion without pain, normal range of motion and neck supple. No neck rigidity, spinous process tenderness or muscular tenderness.     Trachea: Phonation normal.     Meningeal: Kernig's sign absent.  Cardiovascular:     Rate and Rhythm: Normal rate and regular rhythm.  Pulmonary:     Effort: Pulmonary effort is normal. No respiratory distress.     Breath sounds: Normal breath sounds.  Abdominal:     General: There is no distension.     Palpations: Abdomen is soft.     Tenderness: There is no abdominal tenderness. There is no guarding.  Musculoskeletal: Normal range of motion.  Skin:    General: Skin is warm.     Capillary Refill: Capillary refill takes less than 2 seconds.     Findings: No rash.  Neurological:     Mental Status: He is alert and oriented to person, place, and time.     GCS: GCS eye subscore is 4. GCS verbal subscore is 5. GCS motor subscore is 6.     Cranial Nerves: No cranial nerve deficit.     Sensory: Sensation is intact. No sensory deficit.     Motor: Motor function is intact. No abnormal muscle tone.     Coordination: Coordination normal.     Gait: Gait normal.     Deep Tendon Reflexes:     Reflex Scores:      Tricep  reflexes are 2+ on the right side and 2+ on the left side.      Bicep reflexes are 2+ on the right side and 2+ on the left side.    Comments: CN III-XII grossly intact  Psychiatric:        Thought Content: Thought content normal.      ED Treatments / Results  Labs (all labs ordered are listed, but only abnormal results are displayed) Labs Reviewed  NOVEL CORONAVIRUS, NAA (HOSP ORDER, SEND-OUT TO REF LAB; TAT 18-24 HRS)    EKG None  Radiology No results found.  Procedures  Procedures (including critical care time)  Medications Ordered in ED Medications  ketorolac (TORADOL) injection 60 mg (60 mg Intramuscular Given 05/31/19 1001)  metoCLOPramide (REGLAN) injection 10 mg (10 mg Intramuscular Given 05/31/19 1001)  diphenhydrAMINE (BENADRYL) capsule 25 mg (25 mg Oral Given 05/31/19 1001)     Initial Impression / Assessment and Plan / ED Course  I have reviewed the triage vital signs and the nursing notes.  Pertinent labs & imaging results that were available during my care of the patient were reviewed by me and considered in my medical decision making (see chart for details).        Pt with multiple family members who are COVID +   Last COVID test was 05/19/19.  Mild sx's starting today.  He is well appearing.  No meningeal signs.  No focal neuro deficits.  No thunderclap headache.    On recheck, headache much improved.  COVID test today is pending.    Final Clinical Impressions(s) / ED Diagnoses   Final diagnoses:  Headache disorder  Viral illness    ED Discharge Orders    None       Pauline Ausriplett, Najmah Carradine, PA-C 05/31/19 1510    Jacalyn LefevreHaviland, Julie, MD 05/31/19 1517

## 2019-06-01 LAB — NOVEL CORONAVIRUS, NAA (HOSP ORDER, SEND-OUT TO REF LAB; TAT 18-24 HRS): SARS-CoV-2, NAA: NOT DETECTED

## 2019-08-28 ENCOUNTER — Other Ambulatory Visit: Payer: Self-pay

## 2019-08-28 ENCOUNTER — Emergency Department (HOSPITAL_COMMUNITY)
Admission: EM | Admit: 2019-08-28 | Discharge: 2019-08-28 | Disposition: A | Discharge status: Home / Self Care | Payer: BC Managed Care – PPO | Attending: Emergency Medicine | Admitting: Emergency Medicine

## 2019-08-28 ENCOUNTER — Encounter (HOSPITAL_COMMUNITY): Payer: Self-pay | Admitting: *Deleted

## 2019-08-28 DIAGNOSIS — R519 Headache, unspecified: Secondary | ICD-10-CM | POA: Insufficient documentation

## 2019-08-28 DIAGNOSIS — Z20822 Contact with and (suspected) exposure to covid-19: Secondary | ICD-10-CM | POA: Diagnosis not present

## 2019-08-28 DIAGNOSIS — R05 Cough: Secondary | ICD-10-CM | POA: Insufficient documentation

## 2019-08-28 DIAGNOSIS — R439 Unspecified disturbances of smell and taste: Secondary | ICD-10-CM | POA: Insufficient documentation

## 2019-08-28 DIAGNOSIS — U071 COVID-19: Secondary | ICD-10-CM

## 2019-08-28 MED ORDER — ALBUTEROL SULFATE HFA 108 (90 BASE) MCG/ACT IN AERS: 2.0000 | INHALATION_SPRAY | RESPIRATORY_TRACT | 0 refills | Status: DC | PRN

## 2019-08-28 MED ORDER — BENZONATATE 100 MG PO CAPS: 200.0000 mg | ORAL_CAPSULE | Freq: Two times a day (BID) | ORAL | 0 refills | Status: DC | PRN

## 2019-08-28 NOTE — ED Notes (Signed)
Pt reports he has had 8 previous covid tests  Lives with family and one after another is testing positive for test   Here due to cough w expectorant

## 2019-08-28 NOTE — ED Provider Notes (Signed)
[facility] EMERGENCY DEPARTMENT Provider Note   CSN: [id number] Arrival date & time: 08/28/19  1823     History Chief Complaint  Patient presents with  . Cough    Tim Tucker is a 29 y.o. male.  HPI   This patient is a 29 year old male, presents to the hospital today with a complaint of a cough and a headache and have lost in his sense of taste and smell.  This been going on for couple of days, he was supposed to visit his grandparents tomorrow but because of concerns of possibly having COVID-19 he decided to come get evaluated.  He denies fevers or chills, denies myalgias, denies any gastrointestinal symptoms.  His symptoms are persistent, nothing seems to make it better or worse, no medication given prior to arrival.  History reviewed. No pertinent past medical history.  Patient Active Problem List   Diagnosis Date Noted  . Perirectal abscess s/p I&D 04/19/2017 04/19/2017    Past Surgical History:  Procedure Laterality Date  . APPENDECTOMY    . INCISION AND DRAINAGE PERIRECTAL ABSCESS N/A 04/19/2017   Procedure: IRRIGATION AND DEBRIDEMENT PERIRECTAL ABSCESS;  Surgeon: Ovidio Kin, MD;  Location: [facility];  Service: General;  Laterality: N/A;       Family History  Problem Relation Age of Onset  . Hypertension Mother   . Heart failure Mother   . Hypertension Father   . Heart failure Father     Social History   Tobacco Use  . Smoking status: Never Smoker  . Smokeless tobacco: Never Used  Substance Use Topics  . Alcohol use: No  . Drug use: No    Home Medications Prior to Admission medications   Medication Sig Start Date End Date Taking? Authorizing Provider  acetaminophen (TYLENOL) 325 MG tablet You can use this and alternate it with the ibuprofen for pain.  This is in your prescribed pain pill.  Save the prescribe pain pill with hydrocodone for pain not relieved by Tylenol or ibuprofen.  DO NOT TAKE MORE THAN 4000 MG OF TYLENOL PER DAY.  IT CAN HARM YOUR  LIVER.  TYLENOL (ACETAMINOPHEN) IS ALSO IN YOUR PRESCRIPTION PAIN MEDICATION.  YOU HAVE TO COUNT IT IN YOUR DAILY TOTAL. Patient not taking: Reported on 05/31/2019 04/22/17   Sherrie George, PA-C  albuterol (VENTOLIN HFA) 108 [phone] Base) MCG/ACT inhaler Inhale 2 puffs into the lungs every 4 (four) hours as needed for wheezing or shortness of breath. 08/28/19   Eber Hong, MD  benzonatate (TESSALON) 100 MG capsule Take 2 capsules (200 mg total) by mouth 2 (two) times daily as needed for cough. 08/28/19   Eber Hong, MD  ibuprofen (ADVIL,MOTRIN) 200 MG tablet You can take 2-3 tablets every 6 hours as needed for pain.  Use this as your first line drug for pain.  You can buy over the counter at any drug store. 04/22/17   Sherrie George, PA-C  naproxen (NAPROSYN) 500 MG tablet Take 1 tablet (500 mg total) by mouth 2 (two) times daily with a meal. Patient not taking: Reported on 05/31/2019 04/27/19   Triplett, Tammy, PA-C  penicillin v potassium (VEETID) 500 MG tablet Take 1 tablet (500 mg total) by mouth 3 (three) times daily. Patient not taking: Reported on 05/31/2019 04/27/19   Pauline Aus, PA-C  saccharomyces boulardii (FLORASTOR) 250 MG capsule You can buy this at any drug store over the counter.  Take it for the next 10 days.   This is to help replace good bacteria  killed by the antibiotics. Patient not taking: Reported on 05/31/2019 04/22/17   Earnstine Regal, PA-C    Allergies    Vancomycin  Review of Systems   Review of Systems  All other systems reviewed and are negative.   Physical Exam Updated Vital Signs BP (!) 148/89 (BP Location: Right Arm)   Pulse 84   Temp 98.2 F (36.8 C) (Oral)   Resp 18   Ht 1.753 m (5\' 9" )   Wt 97.5 kg   SpO2 95%   BMI 31.75 kg/m   Physical Exam Vitals and nursing note reviewed.  Constitutional:      General: He is not in acute distress.    Appearance: He is well-developed.  HENT:     Head: Normocephalic and atraumatic.     Nose:  Rhinorrhea present.     Mouth/Throat:     Pharynx: No oropharyngeal exudate.  Eyes:     General: No scleral icterus.       Right eye: No discharge.        Left eye: No discharge.     Conjunctiva/sclera: Conjunctivae normal.     Pupils: Pupils are equal, round, and reactive to light.  Neck:     Thyroid: No thyromegaly.     Vascular: No JVD.  Cardiovascular:     Rate and Rhythm: Normal rate and regular rhythm.     Heart sounds: Normal heart sounds. No murmur. No friction rub. No gallop.   Pulmonary:     Effort: Pulmonary effort is normal. No respiratory distress.     Breath sounds: Wheezing present. No rales.     Comments: Mild expiratory wheeze but speaks in full sentences, frequent coughing spells Abdominal:     General: Bowel sounds are normal. There is no distension.     Palpations: Abdomen is soft. There is no mass.     Tenderness: There is no abdominal tenderness.  Musculoskeletal:        General: No tenderness. Normal range of motion.     Cervical back: Normal range of motion and neck supple.  Lymphadenopathy:     Cervical: No cervical adenopathy.  Skin:    General: Skin is warm and dry.     Findings: No erythema or rash.  Neurological:     Mental Status: He is alert.     Coordination: Coordination normal.  Psychiatric:        Behavior: Behavior normal.     ED Results / Procedures / Treatments   Labs (all labs ordered are listed, but only abnormal results are displayed) Labs Reviewed  SARS CORONAVIRUS 2 (TAT 6-24 HRS)    EKG None  Radiology No results found.  Procedures Procedures (including critical care time)  Medications Ordered in ED Medications - No data to display  ED Course  I have reviewed the triage vital signs and the nursing notes.  Pertinent labs & imaging results that were available during my care of the patient were reviewed by me and considered in my medical decision making (see chart for details).    MDM Rules/Calculators/A&P                       The patient has normal vital signs without hypoxia tachycardia fever or hypotension.  Exam and history consistent with a viral upper respiratory infection, likely coronavirus 19, sample sent, patient given instructions on quarantine and staying away from others especially his elderly grandparents.  He agreeable.  Stable for discharge  Final  Clinical Impression(s) / ED Diagnoses Final diagnoses:  COVID-19    Rx / DC Orders ED Discharge Orders         Ordered    benzonatate (TESSALON) 100 MG capsule  2 times daily PRN     08/28/19 1839    albuterol (VENTOLIN HFA) 108 (90 Base) MCG/ACT inhaler  Every 4 hours PRN     08/28/19 1839           Eber Hong, MD 08/28/19 [phone]

## 2019-08-28 NOTE — ED Triage Notes (Signed)
Pt with head congestion and productive -brown/yellow in color cough. Loss of taste and smell starting today.  Denies any covid contact.

## 2019-08-28 NOTE — Discharge Instructions (Signed)
Your test will likely be positive for Covid.  Please assume that you are infectious to others, even if you have a negative result you likely have some type of virus that may be contagious to others.  Please follow the instructions in the notes about how long to quarantine.  Essentially 10 days from the first day of illness as long as you are fever free improving with regards to your shortness of breath and cough.  Stay away from everybody is you are contagious if you are within 6 feet of them, wash your hands frequently, cover your mouth, use the cough medication as needed and the inhaler every 4 hours as needed for shortness of breath or wheezing.

## 2019-08-29 LAB — SARS CORONAVIRUS 2 (TAT 6-24 HRS): SARS Coronavirus 2: NEGATIVE

## 2021-02-25 ENCOUNTER — Encounter (HOSPITAL_COMMUNITY): Payer: Self-pay

## 2021-02-25 ENCOUNTER — Other Ambulatory Visit: Payer: Self-pay

## 2021-02-25 ENCOUNTER — Emergency Department (HOSPITAL_COMMUNITY)
Admission: EM | Admit: 2021-02-25 | Discharge: 2021-02-25 | Disposition: A | Discharge status: Home / Self Care | Payer: Commercial Managed Care - PPO | Attending: Emergency Medicine | Admitting: Emergency Medicine

## 2021-02-25 ENCOUNTER — Emergency Department (HOSPITAL_COMMUNITY): Payer: Commercial Managed Care - PPO

## 2021-02-25 DIAGNOSIS — M546 Pain in thoracic spine: Secondary | ICD-10-CM | POA: Insufficient documentation

## 2021-02-25 DIAGNOSIS — Y92410 Unspecified street and highway as the place of occurrence of the external cause: Secondary | ICD-10-CM | POA: Insufficient documentation

## 2021-02-25 DIAGNOSIS — R079 Chest pain, unspecified: Secondary | ICD-10-CM | POA: Insufficient documentation

## 2021-02-25 DIAGNOSIS — M25511 Pain in right shoulder: Secondary | ICD-10-CM | POA: Insufficient documentation

## 2021-02-25 MED ORDER — METHOCARBAMOL 500 MG PO TABS: 500.0000 mg | ORAL_TABLET | Freq: Two times a day (BID) | ORAL | 0 refills | Status: DC

## 2021-02-25 MED ORDER — HYDROCODONE-ACETAMINOPHEN 5-325 MG PO TABS
1.0000 | ORAL_TABLET | Freq: Once | ORAL | Status: AC
  Administered 2021-02-25: 1 via ORAL
  Filled 2021-02-25: qty 1

## 2021-02-25 NOTE — Discharge Instructions (Addendum)
You were given a prescription for Robaxin which is a muscle relaxer.  You should not drive, work, or operate machinery while taking this medication as it can make you very drowsy.    Please follow up with your Dr. Romeo Apple within 5-7 days for re-evaluation of your symptoms.  Please return to the emergency department for any new or worsening symptoms.
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
## 2021-03-17 ENCOUNTER — Other Ambulatory Visit: Payer: Self-pay | Admitting: Orthopedic Surgery

## 2021-03-17 DIAGNOSIS — M25511 Pain in right shoulder: Secondary | ICD-10-CM

## 2021-03-29 ENCOUNTER — Ambulatory Visit
Admission: RE | Admit: 2021-03-29 | Discharge: 2021-03-29 | Disposition: A | Discharge status: Home / Self Care | Payer: BC Managed Care – PPO | Source: Ambulatory Visit | Attending: Orthopedic Surgery | Admitting: Orthopedic Surgery

## 2021-03-29 ENCOUNTER — Ambulatory Visit
Admission: RE | Admit: 2021-03-29 | Discharge: 2021-03-29 | Disposition: A | Discharge status: Home / Self Care | Payer: Self-pay | Source: Ambulatory Visit | Attending: Orthopedic Surgery | Admitting: Orthopedic Surgery

## 2021-03-29 ENCOUNTER — Other Ambulatory Visit: Payer: Self-pay

## 2021-03-29 ENCOUNTER — Other Ambulatory Visit: Payer: BC Managed Care – PPO

## 2021-03-29 DIAGNOSIS — M25511 Pain in right shoulder: Secondary | ICD-10-CM

## 2021-03-29 MED ORDER — GADOBENATE DIMEGLUMINE 529 MG/ML IV SOLN
20.0000 mL | Freq: Once | INTRAVENOUS | Status: AC | PRN
  Administered 2021-03-29: 20 mL via INTRAVENOUS

## 2022-03-13 ENCOUNTER — Ambulatory Visit: Payer: Commercial Managed Care - PPO | Admitting: Neurology

## 2022-03-13 ENCOUNTER — Encounter: Payer: Self-pay | Admitting: Neurology

## 2022-03-13 VITALS — BP 125/80 | HR 83 | Ht 69.0 in | Wt 234.0 lb

## 2022-03-13 DIAGNOSIS — G40909 Epilepsy, unspecified, not intractable, without status epilepticus: Secondary | ICD-10-CM | POA: Diagnosis not present

## 2022-03-13 MED ORDER — VALTOCO 15 MG DOSE 7.5 MG/0.1ML NA LQPK: 15.0000 mg | NASAL | 2 refills | Status: DC | PRN

## 2022-03-13 MED ORDER — LEVETIRACETAM 1000 MG PO TABS: 1000.0000 mg | ORAL_TABLET | Freq: Two times a day (BID) | ORAL | 11 refills | Status: DC

## 2022-03-13 NOTE — Progress Notes (Signed)
[facility]  PATIENT: Tim Tucker DOB: 20-Nov-1990  REQUESTING CLINICIAN: No ref. provider found HISTORY FROM: Patient and wife  REASON FOR VISIT: New onset seizures    HISTORICAL  CHIEF COMPLAINT:  Chief Complaint  Patient presents with   New Patient (Initial Visit)    Room 12, c/o daily headaches and head pressure, reports 4 seizure like events in the last four days     HISTORY OF PRESENT ILLNESS:  This is a 31 year old 31 year old gentleman past medical history of hypertension who is presenting after motor after multiple seizure disorder for the past week.  Week.  Patient reported first seizure happened last Friday, September 8.  He was getting his haircut did have a sudden episode of episode of unresponsiveness wife reports wife reported he threw with wife reported he threw his head back was sweating had a grunting nerves.  Initially he was stiff then he started having convulsion.  After the episode he was confused and complained of headaches.  On Saturday while they were driving he had an episode of feeling feeling hot, felt like he was about to have another seizure, he pulled on the side and wife had to continue driving. After the episode on Saturday, he presented to the ED and was started on Keppra 500 mg twice daily.  She reported Sunday night he had another seizure during during sleep, wife woke up to walk up to him shaking the bed. Denies any injuries from the seizures, yesterday, he woke up with a broken tooth, no sure how he broke it.  He reports a history of head trauma at the age of 3 months, no previous seizures in the past.    Handedness: Right handed 03/09/22  Onset: Last Friday   Seizure Type: Denies   Current frequency: had 4 events since last Friday   Any injuries from seizures: None   Seizure risk factors: Histroy of head trauma at the age of 69 months, family history   Previous ASMs: None   Currenty ASMs: Levetiracetam   ASMs side  effects: None   Brain Images: Norma head CT   Previous EEGs: None    OTHER MEDICAL CONDITIONS: Hypertension, Seizure disoder   REVIEW OF SYSTEMS: Full 14 system review of systems performed and negative with exception of: As noted in the HPI   ALLERGIES: Allergies  Allergen Reactions   Vancomycin Itching    HOME MEDICATIONS: Outpatient Medications Prior to Visit  Medication Sig Dispense Refill   hydrOXYzine (VISTARIL) 25 MG capsule Take by mouth.     losartan (COZAAR) 100 MG tablet Take 100 mg by mouth daily.     levETIRAcetam (KEPPRA) 500 MG tablet Take 500 mg by mouth 2 (two) times daily.     acetaminophen (TYLENOL) 325 MG tablet You can use this and alternate it with the ibuprofen for pain.  This is in your prescribed pain pill.  Save the prescribe pain pill with hydrocodone for pain not relieved by Tylenol or ibuprofen.  DO NOT TAKE MORE THAN 4000 MG OF TYLENOL PER DAY.  IT CAN HARM YOUR LIVER.  TYLENOL (ACETAMINOPHEN) IS ALSO IN YOUR PRESCRIPTION PAIN MEDICATION.  YOU HAVE TO COUNT IT IN YOUR DAILY TOTAL. (Patient not taking: Reported on 05/31/2019) 100 tablet 2   albuterol (VENTOLIN HFA) 108 (90 Base) MCG/ACT inhaler Inhale 2 puffs into the lungs every 4 (four) hours as needed for wheezing or shortness of breath. 6.7 g 0   benzonatate (TESSALON) 100 MG capsule Take 2 capsules (  200 mg total) by mouth 2 (two) times daily as needed for cough. 20 capsule 0   ibuprofen (ADVIL,MOTRIN) 200 MG tablet You can take 2-3 tablets every 6 hours as needed for pain.  Use this as your first line drug for pain.  You can buy over the counter at any drug store.     methocarbamol (ROBAXIN) 500 MG tablet Take 1 tablet (500 mg total) by mouth 2 (two) times daily. 20 tablet 0   naproxen (NAPROSYN) 500 MG tablet Take 1 tablet (500 mg total) by mouth 2 (two) times daily with a meal. (Patient not taking: Reported on 05/31/2019) 20 tablet 0   penicillin v potassium (VEETID) 500 MG tablet Take 1 tablet (500  mg total) by mouth 3 (three) times daily. (Patient not taking: Reported on 05/31/2019) 30 tablet 0   saccharomyces boulardii (FLORASTOR) 250 MG capsule You can buy this at any drug store over the counter.  Take it for the next 10 days.   This is to help replace good bacteria killed by the antibiotics. (Patient not taking: Reported on 05/31/2019)     No facility-administered medications prior to visit.    PAST MEDICAL HISTORY: History reviewed. No pertinent past medical history.  PAST SURGICAL HISTORY: Past Surgical History:  Procedure Laterality Date   APPENDECTOMY     INCISION AND DRAINAGE PERIRECTAL ABSCESS N/A 04/19/2017   Procedure: IRRIGATION AND DEBRIDEMENT PERIRECTAL ABSCESS;  Surgeon: Ovidio Kin, MD;  Location: [facility];  Service: General;  Laterality: N/A;    FAMILY HISTORY: Family History  Problem Relation Age of Onset   Hypertension Mother    Heart failure Mother    Hypertension Father    Heart failure Father     SOCIAL HISTORY: Social History   Socioeconomic History   Marital status: Married    Spouse name: Not on file   Number of children: Not on file   Years of education: Not on file   Highest education level: Not on file  Occupational History   Not on file  Tobacco Use   Smoking status: Never   Smokeless tobacco: Never  Vaping Use   Vaping Use: Every day   Substances: Flavoring  Substance and Sexual Activity   Alcohol use: No   Drug use: No   Sexual activity: Yes  Other Topics Concern   Not on file  Social History Narrative   Not on file   Social Determinants of Health   Financial Resource Strain: Not on file  Food Insecurity: Not on file  Transportation Needs: Not on file  Physical Activity: Not on file  Stress: Not on file  Social Connections: Not on file  Intimate Partner Violence: Not on file     PHYSICAL EXAM  GENERAL EXAM/CONSTITUTIONAL: Vitals:  Vitals:   03/13/22 0912  BP: 125/80  Pulse: 83  Weight: 234 lb (106.1 kg)   Height: 5\' 9"  (1.753 m)   Body mass index is 34.56 kg/m. Wt Readings from Last 3 Encounters:  03/13/22 234 lb (106.1 kg)  02/25/21 220 lb (99.8 kg)  08/28/19 215 lb (97.5 kg)   Patient is in no distress; well developed, nourished and groomed; neck is supple. Multiple bites (ticks and mosquitos) in the BLE  EYES: Pupils round and reactive to light, Visual fields full to confrontation, Extraocular movements intacts,  No results found.  MUSCULOSKELETAL: Gait, strength, tone, movements noted in Neurologic exam below  NEUROLOGIC: MENTAL STATUS:      No data to display  awake, alert, oriented to person, place and time recent and remote memory intact normal attention and concentration language fluent, comprehension intact, naming intact fund of knowledge appropriate  CRANIAL NERVE:  2nd, 3rd, 4th, 6th - pupils equal and reactive to light, visual fields full to confrontation, extraocular muscles intact, no nystagmus 5th - facial sensation symmetric 7th - facial strength symmetric 8th - hearing intact 9th - palate elevates symmetrically, uvula midline 11th - shoulder shrug symmetric 12th - tongue protrusion midline  MOTOR:  normal bulk and tone, full strength in the BUE, BLE except for mild weakness in the right hip flexion 4+/5  SENSORY:  normal and symmetric to light touch  COORDINATION:  finger-nose-finger, fine finger movements normal  REFLEXES:  deep tendon reflexes present and symmetric  GAIT/STATION:  normal   DIAGNOSTIC DATA (LABS, IMAGING, TESTING) - I reviewed patient records, labs, notes, testing and imaging myself where available.  Lab Results  Component Value Date   WBC 12.8 (H) 04/22/2017   HGB 13.5 04/22/2017   HCT 40.3 04/22/2017   MCV 84.5 04/22/2017   PLT 293 04/22/2017      Component Value Date/Time   NA 138 04/19/2017 0801   K 3.9 04/19/2017 0801   CL 104 04/19/2017 0801   CO2 25 04/19/2017 0801   GLUCOSE 112 (H) 04/19/2017  0801   BUN 16 04/19/2017 0801   CREATININE 0.93 04/19/2017 0801   CALCIUM 9.1 04/19/2017 0801   PROT 7.3 04/19/2017 0801   ALBUMIN 3.6 04/19/2017 0801   AST 15 04/19/2017 0801   ALT 21 04/19/2017 0801   ALKPHOS 78 04/19/2017 0801   BILITOT 0.7 04/19/2017 0801   GFRNONAA >60 04/19/2017 0801   GFRAA >60 04/19/2017 0801   No results found for: "CHOL", "HDL", "LDLCALC", "LDLDIRECT", "TRIG" No results found for: "HGBA1C" No results found for: "VITAMINB12" No results found for: "TSH"  CT Head 03/10/22 1. No acute intracranial abnormality.     ASSESSMENT AND PLAN  31 y.o. year old male  with history of hypertension, who is presenting after multiple seizures. He is already on Keppra 500 mg BID, plan to increase to 1000 mg BID and to obtain routine EEG. Based on routine EEG, will obtain either brain MRI or another prolong 48 hrs EEG. Also prescribed them Valtoco as rescue medications.  Follow up in 3 months or sooner if worse. Advised them to contact me if patient has another breakthrough seizure.    1. Seizure disorder [facility])     Patient Instructions  Increase Keppra to 1000 mg twice daily Routine EEG, if normal we will proceed with a ambulatory EEG. If abnormal we will obtain Brain MRI  Valtoco as rescue medication Trial of naproxen for headache, can take up to 3 tablets of 220 mg with food Follow-up in 3 months or sooner if worse Please contact me if you do have a breakthrough seizure     Per [facility] statutes, patients with seizures are not allowed to drive until they have been seizure-free for six months.  Other recommendations include using caution when using heavy equipment or power tools. Avoid working on ladders or at heights. Take showers instead of baths.  Do not swim alone.  Ensure the water temperature is not too high on the home water heater. Do not go swimming alone. Do not lock yourself in a room alone (i.e. bathroom). When caring for infants or small  children, sit down when holding, feeding, or changing them to minimize risk of injury  to the child in the event you have a seizure. Maintain good sleep hygiene. Avoid alcohol.  Also recommend adequate sleep, hydration, good diet and minimize stress.   During the Seizure  - First, ensure adequate ventilation and place patients on the floor on their left side  Loosen clothing around the neck and ensure the airway is patent. If the patient is clenching the teeth, do not force the mouth open with any object as this can cause severe damage - Remove all items from the surrounding that can be hazardous. The patient may be oblivious to what's happening and may not even know what he or she is doing. If the patient is confused and wandering, either gently guide him/her away and block access to outside areas - Reassure the individual and be comforting - Call 911. In most cases, the seizure ends before EMS arrives. However, there are cases when seizures may last over 3 to 5 minutes. Or the individual may have developed breathing difficulties or severe injuries. If a pregnant patient or a person with diabetes develops a seizure, it is prudent to call an ambulance. - Finally, if the patient does not regain full consciousness, then call EMS. Most patients will remain confused for about 45 to 90 minutes after a seizure, so you must use judgment in calling for help. - Avoid restraints but make sure the patient is in a bed with padded side rails - Place the individual in a lateral position with the neck slightly flexed; this will help the saliva drain from the mouth and prevent the tongue from falling backward - Remove all nearby furniture and other hazards from the area - Provide verbal assurance as the individual is regaining consciousness - Provide the patient with privacy if possible - Call for help and start treatment as ordered by the caregiver   After the Seizure (Postictal Stage)  After a seizure, most  patients experience confusion, fatigue, muscle pain and/or a headache. Thus, one should permit the individual to sleep. For the next few days, reassurance is essential. Being calm and helping reorient the person is also of importance.  Most seizures are painless and end spontaneously. Seizures are not harmful to others but can lead to complications such as stress on the lungs, brain and the heart. Individuals with prior lung problems may develop labored breathing and respiratory distress.     Orders Placed This Encounter  Procedures   EEG adult    Meds ordered this encounter  Medications   diazePAM, 15 MG Dose, (VALTOCO 15 MG DOSE) 2 x 7.5 MG/0.1ML LQPK    Sig: Place 15 mg into the nose as needed.    Dispense:  2 each    Refill:  2   levETIRAcetam (KEPPRA) 1000 MG tablet    Sig: Take 1 tablet (1,000 mg total) by mouth 2 (two) times daily.    Dispense:  60 tablet    Refill:  11    Return in about 3 months (around 06/12/2022).    Windell Norfolk, MD 03/13/2022, 2:08 PM  [facility] [address] [place], [place] [address] [phone]

## 2022-03-13 NOTE — Patient Instructions (Signed)
Increase Keppra to 1000 mg twice daily Routine EEG, if normal we will proceed with a ambulatory EEG. If abnormal we will obtain Brain MRI  Valtoco as rescue medication Trial of naproxen for headache, can take up to 3 tablets of 220 mg with food Follow-up in 3 months or sooner if worse Please contact me if you do have a breakthrough seizure

## 2022-03-14 ENCOUNTER — Encounter: Payer: Self-pay | Admitting: Neurology

## 2022-03-14 ENCOUNTER — Ambulatory Visit (INDEPENDENT_AMBULATORY_CARE_PROVIDER_SITE_OTHER): Payer: Commercial Managed Care - PPO | Admitting: Neurology

## 2022-03-14 DIAGNOSIS — G40909 Epilepsy, unspecified, not intractable, without status epilepticus: Secondary | ICD-10-CM

## 2022-03-14 NOTE — Procedures (Signed)
    History:  31 year old man with seizure disorder   EEG classification: Awake and drowsy  Description of the recording: The background rhythms of this recording consists of a fairly well modulated medium amplitude alpha rhythm of 9-10 Hz that is reactive to eye opening and closure. As the record progresses, the patient appears to remain in the waking state throughout the recording. Photic stimulation was performed, did not show any abnormalities. Hyperventilation was also performed, did not show any abnormalities. Toward the end of the recording, the patient enters the drowsy state with slight symmetric slowing seen. The patient never enters stage II sleep. No abnormal epileptiform discharges seen during this recording. There was intermittent left frontotemporal focal slowing. EKG monitor shows no evidence of cardiac rhythm abnormalities with a heart rate of 72.  Abnormality: Intermittent left frontotemporal slowing  Impression: This is an abnormal EEG recording in the waking and drowsy state due to intermittent left frontal slowing which is consistent with an area of neuronal dysfunction.    Tim Norfolk, MD [facility]

## 2022-03-20 ENCOUNTER — Ambulatory Visit: Payer: Commercial Managed Care - PPO

## 2022-03-20 DIAGNOSIS — G40909 Epilepsy, unspecified, not intractable, without status epilepticus: Secondary | ICD-10-CM

## 2022-03-20 MED ORDER — GADOBENATE DIMEGLUMINE 529 MG/ML IV SOLN
20.0000 mL | Freq: Once | INTRAVENOUS | Status: AC | PRN
  Administered 2022-03-20: 20 mL via INTRAVENOUS

## 2022-03-22 ENCOUNTER — Telehealth: Payer: Self-pay | Admitting: Neurology

## 2022-03-22 NOTE — Telephone Encounter (Signed)
The MRI results are not finalized yet. We will call him up once we get the results.

## 2022-03-22 NOTE — Telephone Encounter (Signed)
Pt states as a result of testing positive for [facility] Spotted Fever, he wants to know if this is causing the swelling in brain and seizure activity, please call

## 2022-04-03 NOTE — Telephone Encounter (Signed)
OK to provide letter. Please have him increase the Keppra to 1500 mg twice daily.

## 2022-04-11 ENCOUNTER — Telehealth: Payer: Self-pay | Admitting: Neurology

## 2022-04-11 NOTE — Telephone Encounter (Signed)
I called patient.  He reports that he went back to work 2 days ago.  He is complaining of dizziness, headaches and shakiness.  He reports that he had these symptoms when he originally presented for consultation with Dr. April Manson.  He is worried about having a seizure.  He denies having actual seizure activity.  He reports that he is taking Keppra 1500 mg twice daily.  He called his primary care office who asked him to call us for further evaluation.  I offered him an appointment with Dr. April Manson tomorrow at 11:45.  He has to call his wife since he cannot drive.  He will let us know within the hour if he can accept this appointment.  If he calls back and can accept this appointment please schedule him for October 12 at 11:45 AM with Dr. April Manson.

## 2022-04-11 NOTE — Telephone Encounter (Signed)
FYI pt called and accepted the 10-12 appointment with 11:15 check in

## 2022-04-11 NOTE — Telephone Encounter (Signed)
Pt is calling. Tim Tucker he went back to work on Monday. Stated he is still very dizzy, bad headaches, and very shaky. Pt is requesting a call from nurse.

## 2022-04-12 ENCOUNTER — Encounter: Payer: Self-pay | Admitting: Neurology

## 2022-04-12 ENCOUNTER — Ambulatory Visit (INDEPENDENT_AMBULATORY_CARE_PROVIDER_SITE_OTHER): Payer: Commercial Managed Care - PPO | Admitting: Neurology

## 2022-04-12 VITALS — BP 117/75 | HR 65 | Ht 69.0 in | Wt 230.5 lb

## 2022-04-12 DIAGNOSIS — G40909 Epilepsy, unspecified, not intractable, without status epilepticus: Secondary | ICD-10-CM

## 2022-04-12 MED ORDER — LAMOTRIGINE 25 MG PO TABS: ORAL_TABLET | ORAL | 0 refills | Status: DC

## 2022-04-12 NOTE — Patient Instructions (Addendum)
Continue with Keppra 1500 mg twice daily Start Lamictal as titrated below Week 1: 25mg  (one pill) at night  Week 2: 25mg  (one pill) twice daily  Week 3: 50mg  (two pills) twice daily  Week 4: 75mg  (three pills) twice daily Week 5: 100mg  (fours pills) twice daily Will obtain a Lamotrigine level (blood test) and complete metabolic panel after week 6  Please stop the medication and call the office as soon as you develop a new rash  If the level of Lamictal within normal limits, we will plan to decrease Keppra back to 2000 mg twice daily Get him off work letter until December 4 Follow-up as scheduled

## 2022-04-12 NOTE — Progress Notes (Signed)
[facility]  PATIENT: Tim Tucker DOB: 08-02-1990  REQUESTING CLINICIAN: No ref. provider found HISTORY FROM: Patient and wife  REASON FOR VISIT: New onset seizures    HISTORICAL  CHIEF COMPLAINT:  Chief Complaint  Patient presents with   Follow-up    Rm 13. Accompanied by wife. dizziness, headaches and shakiness. C/o stuttering. Incidents occur when bending over. C/o balance issues, recent falls. Pt fell four times on Monday.   INTERVAL HISTORY 04/12/2022: Patient presents today for follow-up, he is accompanied by wife, since last visit in September he continued to have breakthrough seizure. We had increased his Keppra to 1500 mg twice daily and his last seizure was in September 22.  Since then, he has not had any additional seizure but complain of dizziness and balance problem, also jitteriness.  He was recently diagnosed with [facility] spotted fever and completed 21 days of doxycycline.  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  He denies any seizures while working, but reports dizziness when looking down, difficulty walking in rough surface. He also reports increase anxiety about the next seizure.    HISTORY OF PRESENT /ILLNESS:  This is a 31 year old 31 year old gentleman past medical history of hypertension who is presenting after motor after multiple seizure disorder for the past week.  Week.  Patient reported first seizure happened last Friday, September 8.  He was getting his haircut did have a sudden episode of episode of unresponsiveness wife reports wife reported he threw with wife reported he threw his head back was sweating had a grunting nerves.  Initially he was stiff then he started having convulsion.  After the episode he was confused and complained of headaches.  On Saturday while they were driving he had an episode of feeling feeling hot, felt like he was about to have another seizure, he pulled on the side and wife had  to continue driving. After the episode on Saturday, he presented to the ED and was started on Keppra 500 mg twice daily.  She reported Sunday night he had another seizure during during sleep, wife woke up to walk up to him shaking the bed. Denies any injuries from the seizures, yesterday, he woke up with a broken tooth, no sure how he broke it.  He reports a history of head trauma at the age of 46 months, no previous seizures in the past.    Handedness: Right handed 03/09/22  Onset: Last Friday   Seizure Type: Denies   Current frequency: had 4 events since last Friday   Any injuries from seizures: None   Seizure risk factors: Histroy of head trauma at the age of 24 months, family history   Previous ASMs: None   Currenty ASMs: Levetiracetam   ASMs side effects: None   Brain Images: Norma head CT   Previous EEGs: None    OTHER MEDICAL CONDITIONS: Hypertension, Seizure disoder   REVIEW OF SYSTEMS: Full 14 system review of systems performed and negative with exception of: As noted in the HPI   ALLERGIES: Allergies  Allergen Reactions   Vancomycin Itching    HOME MEDICATIONS: Outpatient Medications Prior to Visit  Medication Sig Dispense Refill   diazePAM, 15 MG Dose, (VALTOCO 15 MG DOSE) 2 x 7.5 MG/0.1ML LQPK Place 15 mg into the nose as needed. 2 each 2   hydrOXYzine (VISTARIL) 25 MG capsule Take by mouth.     levETIRAcetam (KEPPRA) 1000 MG tablet Take 1 tablet (1,000 mg  total) by mouth 2 (two) times daily. 60 tablet 11   levETIRAcetam (KEPPRA) 500 MG tablet Take 500 mg by mouth 2 (two) times daily.     losartan (COZAAR) 100 MG tablet Take 100 mg by mouth daily.     nebivolol (BYSTOLIC) 10 MG tablet Take 10 mg by mouth daily.     No facility-administered medications prior to visit.    PAST MEDICAL HISTORY: History reviewed. No pertinent past medical history.  PAST SURGICAL HISTORY: Past Surgical History:  Procedure Laterality Date   APPENDECTOMY     INCISION AND  DRAINAGE PERIRECTAL ABSCESS N/A 04/19/2017   Procedure: IRRIGATION AND DEBRIDEMENT PERIRECTAL ABSCESS;  Surgeon: Alphonsa Overall, MD;  Location: [facility];  Service: General;  Laterality: N/A;    FAMILY HISTORY: Family History  Problem Relation Age of Onset   Hypertension Mother    Heart failure Mother    Hypertension Father    Heart failure Father     SOCIAL HISTORY: Social History   Socioeconomic History   Marital status: Married    Spouse name: Not on file   Number of children: Not on file   Years of education: Not on file   Highest education level: Not on file  Occupational History   Not on file  Tobacco Use   Smoking status: Never   Smokeless tobacco: Never  Vaping Use   Vaping Use: Every day   Substances: Flavoring  Substance and Sexual Activity   Alcohol use: No   Drug use: No   Sexual activity: Yes  Other Topics Concern   Not on file  Social History Narrative   Not on file   Social Determinants of Health   Financial Resource Strain: Not on file  Food Insecurity: Not on file  Transportation Needs: Not on file  Physical Activity: Not on file  Stress: Not on file  Social Connections: Not on file  Intimate Partner Violence: Not on file     PHYSICAL EXAM  GENERAL EXAM/CONSTITUTIONAL: Vitals:  Vitals:   04/12/22 1121  BP: 117/75  Pulse: 65  Weight: 230 lb 8 oz (104.6 kg)  Height: 5\' 9"  (1.753 m)   Body mass index is 34.04 kg/m. Wt Readings from Last 3 Encounters:  04/12/22 230 lb 8 oz (104.6 kg)  03/13/22 234 lb (106.1 kg)  02/25/21 220 lb (99.8 kg)   Patient is in no distress; well developed, nourished and groomed; neck is supple. Multiple bites (ticks and mosquitos) in the BLE  EYES: Pupils round and reactive to light, Visual fields full to confrontation, Extraocular movements intacts,  No results found.  MUSCULOSKELETAL: Gait, strength, tone, movements noted in Neurologic exam below  NEUROLOGIC: MENTAL STATUS:      No data to display          awake, alert, oriented to person, place and time recent and remote memory intact normal attention and concentration language fluent, comprehension intact, naming intact fund of knowledge appropriate  CRANIAL NERVE:  2nd, 3rd, 4th, 6th - pupils equal and reactive to light, visual fields full to confrontation, extraocular muscles intact, no nystagmus 5th - facial sensation symmetric 7th - facial strength symmetric 8th - hearing intact 9th - palate elevates symmetrically, uvula midline 11th - shoulder shrug symmetric 12th - tongue protrusion midline  MOTOR:  normal bulk and tone, full strength in the BUE, BLE except for mild weakness in the right hip flexion 4+/5  SENSORY:  normal and symmetric to light touch  COORDINATION:  finger-nose-finger, fine finger  movements normal  REFLEXES:  deep tendon reflexes present and symmetric  GAIT/STATION:  normal   DIAGNOSTIC DATA (LABS, IMAGING, TESTING) - I reviewed patient records, labs, notes, testing and imaging myself where available.  Lab Results  Component Value Date   WBC 12.8 (H) 04/22/2017   HGB 13.5 04/22/2017   HCT 40.3 04/22/2017   MCV 84.5 04/22/2017   PLT 293 04/22/2017      Component Value Date/Time   NA 138 04/19/2017 0801   K 3.9 04/19/2017 0801   CL 104 04/19/2017 0801   CO2 25 04/19/2017 0801   GLUCOSE 112 (H) 04/19/2017 0801   BUN 16 04/19/2017 0801   CREATININE 0.93 04/19/2017 0801   CALCIUM 9.1 04/19/2017 0801   PROT 7.3 04/19/2017 0801   ALBUMIN 3.6 04/19/2017 0801   AST 15 04/19/2017 0801   ALT 21 04/19/2017 0801   ALKPHOS 78 04/19/2017 0801   BILITOT 0.7 04/19/2017 0801   GFRNONAA >60 04/19/2017 0801   GFRAA >60 04/19/2017 0801   No results found for: "CHOL", "HDL", "LDLCALC", "LDLDIRECT", "TRIG" No results found for: "HGBA1C" No results found for: "VITAMINB12" No results found for: "TSH"  CT Head 03/10/22 1. No acute intracranial abnormality.    MRI Brain 03/23/22 Normal MRI  brain (with and without) .   Routine EEG 03/14/22 Abnormality: Intermittent left frontotemporal slowing   Impression: This is an abnormal EEG recording in the waking and drowsy state due to intermittent left frontal slowing which is consistent with an area of neuronal dysfunction.   ASSESSMENT AND PLAN  31 y.o. year old male  with history of hypertension, who is presenting after multiple seizures.  Keppra increased to 1500 mg twice daily but still have a breakthrough seizure.  He also reported increased anxiety about having another seizure, complaint of dizziness balance problems. 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  I do feel the symptoms that he is experiencing are side effects of Keppra. I will start him on Lamictal with plan to increase to 100 mg twice daily.  Plan will be to decrease the Keppra back to 1000 mg or as tolerated.  This was explained to the patient and he voices understanding.  Follow-up in 3 months or sooner if worse.    1. Seizure disorder [facility])      Patient Instructions  Continue with Keppra 1500 mg twice daily Start Lamictal as titrated below Week 1: 25mg  (one pill) at night  Week 2: 25mg  (one pill) twice daily  Week 3: 50mg  (two pills) twice daily  Week 4: 75mg  (three pills) twice daily Week 5: 100mg  (fours pills) twice daily Will obtain a Lamotrigine level (blood test) and complete metabolic panel after week 6  Please stop the medication and call the office as soon as you develop a new rash  If the level of Lamictal within normal limits, we will plan to decrease Keppra back to 2000 mg twice daily Get him off work letter until December 4 Follow-up as scheduled     Per statutes, patients with seizures are not allowed to drive until they have been seizure-free for six months.  Other recommendations include using caution when using heavy equipment or power tools. Avoid working on ladders or  at heights. Take showers instead of baths.  Do not swim alone.  Ensure the water temperature is not too high on the home water heater. Do not go swimming alone. Do not lock  yourself in a room alone (i.e. bathroom). When caring for infants or small children, sit down when holding, feeding, or changing them to minimize risk of injury to the child in the event you have a seizure. Maintain good sleep hygiene. Avoid alcohol.  Also recommend adequate sleep, hydration, good diet and minimize stress.   During the Seizure  - First, ensure adequate ventilation and place patients on the floor on their left side  Loosen clothing around the neck and ensure the airway is patent. If the patient is clenching the teeth, do not force the mouth open with any object as this can cause severe damage - Remove all items from the surrounding that can be hazardous. The patient may be oblivious to what's happening and may not even know what he or she is doing. If the patient is confused and wandering, either gently guide him/her away and block access to outside areas - Reassure the individual and be comforting - Call 911. In most cases, the seizure ends before EMS arrives. However, there are cases when seizures may last over 3 to 5 minutes. Or the individual may have developed breathing difficulties or severe injuries. If a pregnant patient or a person with diabetes develops a seizure, it is prudent to call an ambulance. - Finally, if the patient does not regain full consciousness, then call EMS. Most patients will remain confused for about 45 to 90 minutes after a seizure, so you must use judgment in calling for help. - Avoid restraints but make sure the patient is in a bed with padded side rails - Place the individual in a lateral position with the neck slightly flexed; this will help the saliva drain from the mouth and prevent the tongue from falling backward - Remove all nearby furniture and other hazards from the area -  Provide verbal assurance as the individual is regaining consciousness - Provide the patient with privacy if possible - Call for help and start treatment as ordered by the caregiver   After the Seizure (Postictal Stage)  After a seizure, most patients experience confusion, fatigue, muscle pain and/or a headache. Thus, one should permit the individual to sleep. For the next few days, reassurance is essential. Being calm and helping reorient the person is also of importance.  Most seizures are painless and end spontaneously. Seizures are not harmful to others but can lead to complications such as stress on the lungs, brain and the heart. Individuals with prior lung problems may develop labored breathing and respiratory distress.     No orders of the defined types were placed in this encounter.   Meds ordered this encounter  Medications   lamoTRIgine (LAMICTAL) 25 MG tablet    Sig: Week 1: 25mg  (one pill) at night, then Week 2: 25mg  (one pill) twice daily, then Week 3: 50 mg (two pills) twice daily, then Week 4: 75mg  (three pills) twice daily and then week 5: 100mg  (fours pills) twice daily.    Dispense:  240 tablet    Refill:  0    No follow-ups on file.    , MD 04/12/2022, 7:50 PM  [facility] [address] [place], [place] 06/12/2022 [phone]

## 2022-05-18 ENCOUNTER — Encounter: Payer: Self-pay | Admitting: Neurology

## 2022-05-30 MED ORDER — LAMOTRIGINE 100 MG PO TABS: 100.0000 mg | ORAL_TABLET | Freq: Two times a day (BID) | ORAL | 3 refills | Status: DC

## 2022-06-04 MED ORDER — LAMOTRIGINE 100 MG PO TABS: 100.0000 mg | ORAL_TABLET | Freq: Two times a day (BID) | ORAL | 3 refills | Status: DC

## 2022-06-04 NOTE — Addendum Note (Signed)
Addended by: Ann Maki on: 06/04/2022 08:07 AM   Modules accepted: Orders

## 2022-06-19 ENCOUNTER — Ambulatory Visit: Payer: Commercial Managed Care - PPO | Admitting: Neurology

## 2022-06-19 ENCOUNTER — Encounter: Payer: Self-pay | Admitting: Neurology

## 2022-06-19 VITALS — BP 124/75 | HR 74 | Ht 69.0 in | Wt 230.0 lb

## 2022-06-19 DIAGNOSIS — Z5181 Encounter for therapeutic drug level monitoring: Secondary | ICD-10-CM

## 2022-06-19 DIAGNOSIS — G40909 Epilepsy, unspecified, not intractable, without status epilepticus: Secondary | ICD-10-CM | POA: Diagnosis not present

## 2022-06-19 MED ORDER — LAMOTRIGINE 100 MG PO TABS: 150.0000 mg | ORAL_TABLET | Freq: Two times a day (BID) | ORAL | 6 refills | Status: DC

## 2022-06-19 MED ORDER — LEVETIRACETAM 500 MG PO TABS: 500.0000 mg | ORAL_TABLET | Freq: Two times a day (BID) | ORAL | 11 refills | Status: DC

## 2022-06-19 NOTE — Patient Instructions (Signed)
Continue with Keppra 1500 mg twice daily Increase lamotrigine to 150 mg twice daily Will obtain antiseizure medication level today 3 days ambulatory EEG Follow-up in 3 months or sooner if worse

## 2022-06-19 NOTE — Progress Notes (Signed)
[facility]  PATIENT: Tim Tucker DOB: 03-02-1991  REQUESTING CLINICIAN: No ref. provider found HISTORY FROM: Patient and wife  REASON FOR VISIT: New onset seizures    HISTORICAL  CHIEF COMPLAINT:  Chief Complaint  Patient presents with   Follow-up    Rm 13 Last sz 3 weeks , c/o small spells throughout the day    INTERVAL HISTORY 06/19/2022:  Tim Tucker presents today for follow-up, since last visit in October we had increased the Keppra to 1500 mg twice daily and added lamotrigine 100 mg twice daily.  He reports since the addition of lamotrigine, his mood and his anxiety have improved.  He still having staring spells about 2-3 times per week.  He stated during this time he was told that he will stare and be unresponsive.  In terms of the seizures, generalized convulsion, he reported last 1 will was on Thanksgiving. He does report increased stress in his life.  He is separated from his wife, she has custody of the 2 children, he does see them on weekend.  He does discussed with his father who is a Education officer, environmental about his current situation.  He reported his mom is in hospice and may have metastatic cancer.  He has returned to work and enjoys his job.    INTERVAL HISTORY 04/12/2022: Patient presents today for follow-up, he is accompanied by wife, since last visit in September he continued to have breakthrough seizure. We had increased his Keppra to 1500 mg twice daily and his last seizure was in September 22.  Since then, he has not had any additional seizure but complain of dizziness and balance problem, also jitteriness.  He was recently diagnosed with [facility] spotted fever and completed 21 days of doxycycline.  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  He denies any seizures while working, but reports dizziness when looking down, difficulty walking in rough surface. He also reports increase anxiety about the next seizure.    HISTORY OF PRESENT  /ILLNESS:  This is a 31 year old 31 year old gentleman past medical history of hypertension who is presenting after motor after multiple seizure disorder for the past week.  Week.  Patient reported first seizure happened last Friday, September 8.  He was getting his haircut did have a sudden episode of episode of unresponsiveness wife reports wife reported he threw with wife reported he threw his head back was sweating had a grunting nerves.  Initially he was stiff then he started having convulsion.  After the episode he was confused and complained of headaches.  On Saturday while they were driving he had an episode of feeling feeling hot, felt like he was about to have another seizure, he pulled on the side and wife had to continue driving. After the episode on Saturday, he presented to the ED and was started on Keppra 500 mg twice daily.  She reported Sunday night he had another seizure during during sleep, wife woke up to walk up to him shaking the bed. Denies any injuries from the seizures, yesterday, he woke up with a broken tooth, no sure how he broke it.  He reports a history of head trauma at the age of 31 months, no previous seizures in the past.    Handedness: Right handed 03/09/22  Onset: Last Friday   Seizure Type: Denies   Current frequency: had 4 events since last Friday   Any injuries from seizures: None   Seizure risk factors: Histroy of  head trauma at the age of 3 months, family history   Previous ASMs: None   Currenty ASMs: Levetiracetam   ASMs side effects: None   Brain Images: Norma head CT   Previous EEGs: None    OTHER MEDICAL CONDITIONS: Hypertension, Seizure disoder   REVIEW OF SYSTEMS: Full 14 system review of systems performed and negative with exception of: As noted in the HPI   ALLERGIES: Allergies  Allergen Reactions   Vancomycin Itching    HOME MEDICATIONS: Outpatient Medications Prior to Visit  Medication Sig Dispense Refill   diazePAM, 15 MG  Dose, (VALTOCO 15 MG DOSE) 2 x 7.5 MG/0.1ML LQPK Place 15 mg into the nose as needed. 2 each 2   hydrOXYzine (VISTARIL) 25 MG capsule Take by mouth.     levETIRAcetam (KEPPRA) 1000 MG tablet Take 1 tablet (1,000 mg total) by mouth 2 (two) times daily. 60 tablet 11   losartan (COZAAR) 100 MG tablet Take 100 mg by mouth daily.     nebivolol (BYSTOLIC) 10 MG tablet Take 10 mg by mouth daily.     lamoTRIgine (LAMICTAL) 100 MG tablet Take 1 tablet (100 mg total) by mouth 2 (two) times daily. 60 tablet 3   levETIRAcetam (KEPPRA) 500 MG tablet Take 500 mg by mouth 2 (two) times daily.     No facility-administered medications prior to visit.    PAST MEDICAL HISTORY: No past medical history on file.  PAST SURGICAL HISTORY: Past Surgical History:  Procedure Laterality Date   APPENDECTOMY     INCISION AND DRAINAGE PERIRECTAL ABSCESS N/A 04/19/2017   Procedure: IRRIGATION AND DEBRIDEMENT PERIRECTAL ABSCESS;  Surgeon: Alphonsa Overall, MD;  Location: [facility];  Service: General;  Laterality: N/A;    FAMILY HISTORY: Family History  Problem Relation Age of Onset   Hypertension Mother    Heart failure Mother    Hypertension Father    Heart failure Father     SOCIAL HISTORY: Social History   Socioeconomic History   Marital status: Married    Spouse name: Not on file   Number of children: Not on file   Years of education: Not on file   Highest education level: Not on file  Occupational History   Not on file  Tobacco Use   Smoking status: Never   Smokeless tobacco: Never  Vaping Use   Vaping Use: Every day   Substances: Flavoring  Substance and Sexual Activity   Alcohol use: No   Drug use: No   Sexual activity: Yes  Other Topics Concern   Not on file  Social History Narrative   Not on file   Social Determinants of Health   Financial Resource Strain: Not on file  Food Insecurity: Not on file  Transportation Needs: Not on file  Physical Activity: Not on file  Stress: Not on file   Social Connections: Not on file  Intimate Partner Violence: Not on file     PHYSICAL EXAM  GENERAL EXAM/CONSTITUTIONAL: Vitals:  Vitals:   06/19/22 1113  BP: 124/75  Pulse: 74  Weight: 230 lb (104.3 kg)  Height: 5\' 9"  (1.753 m)   Body mass index is 33.97 kg/m. Wt Readings from Last 3 Encounters:  06/19/22 230 lb (104.3 kg)  04/12/22 230 lb 8 oz (104.6 kg)  03/13/22 234 lb (106.1 kg)   Patient is in no distress; well developed, nourished and groomed; neck is supple. Multiple bites (ticks and mosquitos) in the BLE  EYES: Visual fields full to confrontation, Extraocular movements  intacts,  No results found.  MUSCULOSKELETAL: Gait, strength, tone, movements noted in Neurologic exam below  NEUROLOGIC: MENTAL STATUS:      No data to display         awake, alert, oriented to person, place and time recent and remote memory intact normal attention and concentration language fluent, comprehension intact, naming intact fund of knowledge appropriate  CRANIAL NERVE:  2nd, 3rd, 4th, 6th - visual fields full to confrontation, extraocular muscles intact, no nystagmus 5th - facial sensation symmetric 7th - facial strength symmetric 8th - hearing intact 9th - palate elevates symmetrically, uvula midline 11th - shoulder shrug symmetric 12th - tongue protrusion midline  MOTOR:  normal bulk and tone, full strength in the BUE, BLE except for mild weakness in the right hip flexion 4+/5  SENSORY:  normal and symmetric to light touch  COORDINATION:  finger-nose-finger, fine finger movements normal  REFLEXES:  deep tendon reflexes present and symmetric  GAIT/STATION:  normal   DIAGNOSTIC DATA (LABS, IMAGING, TESTING) - I reviewed patient records, labs, notes, testing and imaging myself where available.  Lab Results  Component Value Date   WBC 12.8 (H) 04/22/2017   HGB 13.5 04/22/2017   HCT 40.3 04/22/2017   MCV 84.5 04/22/2017   PLT 293 04/22/2017       Component Value Date/Time   NA 138 04/19/2017 0801   K 3.9 04/19/2017 0801   CL 104 04/19/2017 0801   CO2 25 04/19/2017 0801   GLUCOSE 112 (H) 04/19/2017 0801   BUN 16 04/19/2017 0801   CREATININE 0.93 04/19/2017 0801   CALCIUM 9.1 04/19/2017 0801   PROT 7.3 04/19/2017 0801   ALBUMIN 3.6 04/19/2017 0801   AST 15 04/19/2017 0801   ALT 21 04/19/2017 0801   ALKPHOS 78 04/19/2017 0801   BILITOT 0.7 04/19/2017 0801   GFRNONAA >60 04/19/2017 0801   GFRAA >60 04/19/2017 0801   No results found for: "CHOL", "HDL", "LDLCALC", "LDLDIRECT", "TRIG" No results found for: "HGBA1C" No results found for: "VITAMINB12" No results found for: "TSH"  CT Head 03/10/22 1. No acute intracranial abnormality.    MRI Brain 03/23/22 Normal MRI brain (with and without) .   Routine EEG 03/14/22 Abnormality: Intermittent left frontotemporal slowing   Impression: This is an abnormal EEG recording in the waking and drowsy state due to intermittent left frontal slowing which is consistent with an area of neuronal dysfunction.   ASSESSMENT AND PLAN  31 y.o. year old male  with history of hypertension, who is presenting for follow up for his seizures.  He is on Keppra 1500 mg twice daily and lamotrigine 100 mg twice daily.  He reported his last seizure was on Thanksgiving but lately he is having staring spells, about 2-3 times per week.  Because of the staring spells, I will increase his lamotrigine to 150 twice daily and also obtain antiseizure medication level today.  I will keep him on the same dose of Keppra, 1500 mg twice daily.  Will also obtain a 3-day ambulatory EEG, and I will call him to go over the results.  I will see him in 3 months for follow-up or sooner if worse.  He voices understanding.    1. Seizure disorder ([place])   2. Therapeutic drug monitoring      Patient Instructions  Continue with Keppra 1500 mg twice daily Increase lamotrigine to 150 mg twice daily Will obtain antiseizure  medication level today 3 days ambulatory EEG Follow-up in 3 months or sooner  if worse   Per [facility] statutes, patients with seizures are not allowed to drive until they have been seizure-free for six months.  Other recommendations include using caution when using heavy equipment or power tools. Avoid working on ladders or at heights. Take showers instead of baths.  Do not swim alone.  Ensure the water temperature is not too high on the home water heater. Do not go swimming alone. Do not lock yourself in a room alone (i.e. bathroom). When caring for infants or small children, sit down when holding, feeding, or changing them to minimize risk of injury to the child in the event you have a seizure. Maintain good sleep hygiene. Avoid alcohol.  Also recommend adequate sleep, hydration, good diet and minimize stress.   During the Seizure  - First, ensure adequate ventilation and place patients on the floor on their left side  Loosen clothing around the neck and ensure the airway is patent. If the patient is clenching the teeth, do not force the mouth open with any object as this can cause severe damage - Remove all items from the surrounding that can be hazardous. The patient may be oblivious to what's happening and may not even know what he or she is doing. If the patient is confused and wandering, either gently guide him/her away and block access to outside areas - Reassure the individual and be comforting - Call 911. In most cases, the seizure ends before EMS arrives. However, there are cases when seizures may last over 3 to 5 minutes. Or the individual may have developed breathing difficulties or severe injuries. If a pregnant patient or a person with diabetes develops a seizure, it is prudent to call an ambulance. - Finally, if the patient does not regain full consciousness, then call EMS. Most patients will remain confused for about 45 to 90 minutes after a seizure, so you must use judgment  in calling for help. - Avoid restraints but make sure the patient is in a bed with padded side rails - Place the individual in a lateral position with the neck slightly flexed; this will help the saliva drain from the mouth and prevent the tongue from falling backward - Remove all nearby furniture and other hazards from the area - Provide verbal assurance as the individual is regaining consciousness - Provide the patient with privacy if possible - Call for help and start treatment as ordered by the caregiver   After the Seizure (Postictal Stage)  After a seizure, most patients experience confusion, fatigue, muscle pain and/or a headache. Thus, one should permit the individual to sleep. For the next few days, reassurance is essential. Being calm and helping reorient the person is also of importance.  Most seizures are painless and end spontaneously. Seizures are not harmful to others but can lead to complications such as stress on the lungs, brain and the heart. Individuals with prior lung problems may develop labored breathing and respiratory distress.     Orders Placed This Encounter  Procedures   Levetiracetam level   Lamotrigine level   AMBULATORY EEG    Meds ordered this encounter  Medications   lamoTRIgine (LAMICTAL) 100 MG tablet    Sig: Take 1.5 tablets (150 mg total) by mouth 2 (two) times daily for 210 doses.    Dispense:  45 tablet    Refill:  6   levETIRAcetam (KEPPRA) 500 MG tablet    Sig: Take 1 tablet (500 mg total) by mouth 2 (two) times  daily.    Dispense:  30 tablet    Refill:  11    To take with an additional 1000 mg for a total of 1500 mg BID    Return in about 3 months (around 09/18/2022).    Alric Ran, MD 06/19/2022, 11:42 AM  [facility] [address] [place], [place] [address] [phone]

## 2022-06-20 ENCOUNTER — Telehealth: Payer: Self-pay

## 2022-06-20 LAB — LEVETIRACETAM LEVEL: Levetiracetam Lvl: 33.4 ug/mL (ref 10.0–40.0)

## 2022-06-20 LAB — LAMOTRIGINE LEVEL: Lamotrigine Lvl: 3.7 ug/mL (ref 2.0–20.0)

## 2022-06-20 NOTE — Telephone Encounter (Signed)
Ambulatory EEG faxed to [facility] intake team.

## 2022-06-27 MED ORDER — LEVETIRACETAM 1000 MG PO TABS: 1000.0000 mg | ORAL_TABLET | Freq: Two times a day (BID) | ORAL | 11 refills | Status: DC

## 2022-06-27 NOTE — Addendum Note (Signed)
Addended by: Ann Maki on: 06/27/2022 09:46 AM   Modules accepted: Orders

## 2022-07-13 NOTE — Telephone Encounter (Signed)
Patient is not cleared to drive. His last convulsion was on Thanksgiving week

## 2022-10-01 ENCOUNTER — Ambulatory Visit: Payer: Self-pay | Admitting: Neurology

## 2023-05-27 ENCOUNTER — Other Ambulatory Visit: Payer: Self-pay

## 2023-05-27 ENCOUNTER — Inpatient Hospital Stay (HOSPITAL_COMMUNITY)
Admission: EM | Admit: 2023-05-27 | Discharge: 2023-05-30 | DRG: 854 | Disposition: A | Discharge status: Home / Self Care | Payer: No Typology Code available for payment source | Attending: Internal Medicine | Admitting: Internal Medicine

## 2023-05-27 ENCOUNTER — Emergency Department (HOSPITAL_COMMUNITY): Payer: No Typology Code available for payment source

## 2023-05-27 ENCOUNTER — Encounter (HOSPITAL_COMMUNITY): Payer: Self-pay | Admitting: Emergency Medicine

## 2023-05-27 DIAGNOSIS — Z881 Allergy status to other antibiotic agents status: Secondary | ICD-10-CM | POA: Diagnosis not present

## 2023-05-27 DIAGNOSIS — K603 Anal fistula, unspecified: Secondary | ICD-10-CM

## 2023-05-27 DIAGNOSIS — K625 Hemorrhage of anus and rectum: Secondary | ICD-10-CM | POA: Diagnosis present

## 2023-05-27 DIAGNOSIS — I10 Essential (primary) hypertension: Secondary | ICD-10-CM | POA: Diagnosis present

## 2023-05-27 DIAGNOSIS — K611 Rectal abscess: Secondary | ICD-10-CM | POA: Diagnosis present

## 2023-05-27 DIAGNOSIS — L0390 Cellulitis, unspecified: Secondary | ICD-10-CM

## 2023-05-27 DIAGNOSIS — F17290 Nicotine dependence, other tobacco product, uncomplicated: Secondary | ICD-10-CM | POA: Diagnosis present

## 2023-05-27 DIAGNOSIS — A419 Sepsis, unspecified organism: Principal | ICD-10-CM | POA: Diagnosis present

## 2023-05-27 DIAGNOSIS — Z8249 Family history of ischemic heart disease and other diseases of the circulatory system: Secondary | ICD-10-CM

## 2023-05-27 DIAGNOSIS — L732 Hidradenitis suppurativa: Secondary | ICD-10-CM | POA: Diagnosis present

## 2023-05-27 DIAGNOSIS — L0231 Cutaneous abscess of buttock: Secondary | ICD-10-CM | POA: Diagnosis present

## 2023-05-27 DIAGNOSIS — E66812 Obesity, class 2: Secondary | ICD-10-CM | POA: Insufficient documentation

## 2023-05-27 DIAGNOSIS — Z6835 Body mass index (BMI) 35.0-35.9, adult: Secondary | ICD-10-CM | POA: Diagnosis not present

## 2023-05-27 DIAGNOSIS — L03317 Cellulitis of buttock: Secondary | ICD-10-CM | POA: Diagnosis present

## 2023-05-27 DIAGNOSIS — G40909 Epilepsy, unspecified, not intractable, without status epilepticus: Secondary | ICD-10-CM | POA: Diagnosis present

## 2023-05-27 LAB — CBC WITH DIFFERENTIAL/PLATELET
Abs Immature Granulocytes: 0.16 10*3/uL — ABNORMAL HIGH (ref 0.00–0.07)
Basophils Absolute: 0.1 10*3/uL (ref 0.0–0.1)
Basophils Relative: 1 %
Eosinophils Absolute: 0.3 10*3/uL (ref 0.0–0.5)
Eosinophils Relative: 2 %
HCT: 45.8 % (ref 39.0–52.0)
Hemoglobin: 15.3 g/dL (ref 13.0–17.0)
Immature Granulocytes: 1 %
Lymphocytes Relative: 15 %
Lymphs Abs: 2.5 10*3/uL (ref 0.7–4.0)
MCH: 27.9 pg (ref 26.0–34.0)
MCHC: 33.4 g/dL (ref 30.0–36.0)
MCV: 83.4 fL (ref 80.0–100.0)
Monocytes Absolute: 0.9 10*3/uL (ref 0.1–1.0)
Monocytes Relative: 6 %
Neutro Abs: 12.7 10*3/uL — ABNORMAL HIGH (ref 1.7–7.7)
Neutrophils Relative %: 75 %
Platelets: 365 10*3/uL (ref 150–400)
RBC: 5.49 MIL/uL (ref 4.22–5.81)
RDW: 12.1 % (ref 11.5–15.5)
WBC: 16.7 10*3/uL — ABNORMAL HIGH (ref 4.0–10.5)
nRBC: 0 % (ref 0.0–0.2)

## 2023-05-27 LAB — APTT: aPTT: 28 s (ref 24–36)

## 2023-05-27 LAB — COMPREHENSIVE METABOLIC PANEL
ALT: 24 U/L (ref 0–44)
AST: 15 U/L (ref 15–41)
Albumin: 3.8 g/dL (ref 3.5–5.0)
Alkaline Phosphatase: 82 U/L (ref 38–126)
Anion gap: 10 (ref 5–15)
BUN: 21 mg/dL — ABNORMAL HIGH (ref 6–20)
CO2: 26 mmol/L (ref 22–32)
Calcium: 8.9 mg/dL (ref 8.9–10.3)
Chloride: 99 mmol/L (ref 98–111)
Creatinine, Ser: 0.83 mg/dL (ref 0.61–1.24)
GFR, Estimated: >60 mL/min (ref >60)
Glucose, Bld: 102 mg/dL — ABNORMAL HIGH (ref 70–99)
Potassium: 3.7 mmol/L (ref 3.5–5.1)
Sodium: 135 mmol/L (ref 135–145)
Total Bilirubin: 0.7 mg/dL (ref <1.2)
Total Protein: 7.5 g/dL (ref 6.5–8.1)

## 2023-05-27 LAB — PROTIME-INR
INR: 1 (ref 0.8–1.2)
Prothrombin Time: 13.1 s (ref 11.4–15.2)

## 2023-05-27 LAB — LACTIC ACID, PLASMA: Lactic Acid, Venous: 0.8 mmol/L (ref 0.5–1.9)

## 2023-05-27 LAB — POC OCCULT BLOOD, ED

## 2023-05-27 MED ORDER — LACTATED RINGERS IV BOLUS (SEPSIS)
500.0000 mL | Freq: Once | INTRAVENOUS | Status: AC
  Administered 2023-05-27: 500 mL via INTRAVENOUS

## 2023-05-27 MED ORDER — HYDROCHLOROTHIAZIDE 12.5 MG PO TABS
12.5000 mg | ORAL_TABLET | Freq: Every day | ORAL | Status: DC
  Filled 2023-05-27: qty 1

## 2023-05-27 MED ORDER — MORPHINE SULFATE (PF) 2 MG/ML IV SOLN
2.0000 mg | INTRAVENOUS | Status: DC | PRN
  Administered 2023-05-27 – 2023-05-29 (×6): 2 mg via INTRAVENOUS
  Filled 2023-05-27 (×6): qty 1

## 2023-05-27 MED ORDER — MAGNESIUM HYDROXIDE 400 MG/5ML PO SUSP
30.0000 mL | Freq: Every day | ORAL | Status: DC | PRN
  Administered 2023-05-29: 30 mL via ORAL
  Filled 2023-05-27: qty 30

## 2023-05-27 MED ORDER — TRAZODONE HCL 50 MG PO TABS: 25.0000 mg | ORAL_TABLET | Freq: Every evening | ORAL | Status: DC | PRN

## 2023-05-27 MED ORDER — ONDANSETRON HCL 4 MG PO TABS: 4.0000 mg | ORAL_TABLET | Freq: Four times a day (QID) | ORAL | Status: DC | PRN

## 2023-05-27 MED ORDER — ACETAMINOPHEN 325 MG PO TABS: 650.0000 mg | ORAL_TABLET | Freq: Four times a day (QID) | ORAL | Status: DC | PRN

## 2023-05-27 MED ORDER — LACTATED RINGERS IV BOLUS (SEPSIS)
1000.0000 mL | Freq: Once | INTRAVENOUS | Status: AC
  Administered 2023-05-27: 1000 mL via INTRAVENOUS

## 2023-05-27 MED ORDER — KETOROLAC TROMETHAMINE 15 MG/ML IJ SOLN
15.0000 mg | Freq: Four times a day (QID) | INTRAMUSCULAR | Status: DC | PRN
  Administered 2023-05-28: 15 mg via INTRAVENOUS
  Filled 2023-05-27: qty 1

## 2023-05-27 MED ORDER — PIPERACILLIN-TAZOBACTAM 3.375 G IVPB
3.3750 g | Freq: Three times a day (TID) | INTRAVENOUS | Status: DC
  Administered 2023-05-27 – 2023-05-30 (×8): 3.375 g via INTRAVENOUS
  Filled 2023-05-27 (×8): qty 50

## 2023-05-27 MED ORDER — ONDANSETRON HCL 4 MG/2ML IJ SOLN
4.0000 mg | Freq: Four times a day (QID) | INTRAMUSCULAR | Status: DC | PRN
  Administered 2023-05-28: 4 mg via INTRAVENOUS

## 2023-05-27 MED ORDER — LABETALOL HCL 5 MG/ML IV SOLN: 20.0000 mg | INTRAVENOUS | Status: DC | PRN

## 2023-05-27 MED ORDER — LACTATED RINGERS IV SOLN
150.0000 mL/h | INTRAVENOUS | Status: AC
  Administered 2023-05-27 – 2023-05-28 (×2): 150 mL/h via INTRAVENOUS

## 2023-05-27 MED ORDER — ENOXAPARIN SODIUM 60 MG/0.6ML IJ SOSY
50.0000 mg | PREFILLED_SYRINGE | INTRAMUSCULAR | Status: DC
  Filled 2023-05-27: qty 0.6

## 2023-05-27 MED ORDER — HYDROMORPHONE HCL 1 MG/ML IJ SOLN
0.5000 mg | Freq: Once | INTRAMUSCULAR | Status: AC
  Administered 2023-05-27: 0.5 mg via INTRAVENOUS
  Filled 2023-05-27: qty 0.5

## 2023-05-27 MED ORDER — ACETAMINOPHEN 650 MG RE SUPP: 650.0000 mg | Freq: Four times a day (QID) | RECTAL | Status: DC | PRN

## 2023-05-27 MED ORDER — GADOBUTROL 1 MMOL/ML IV SOLN
10.0000 mL | Freq: Once | INTRAVENOUS | Status: AC | PRN
  Administered 2023-05-27: 10 mL via INTRAVENOUS

## 2023-05-27 MED ORDER — LACTATED RINGERS IV SOLN: 150.0000 mL/h | INTRAVENOUS | Status: DC

## 2023-05-27 MED ORDER — ACETAMINOPHEN 325 MG PO TABS
650.0000 mg | ORAL_TABLET | Freq: Once | ORAL | Status: AC
  Administered 2023-05-27: 650 mg via ORAL
  Filled 2023-05-27: qty 2

## 2023-05-27 NOTE — ED Triage Notes (Addendum)
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 Unable to sit without pain.  Pt was seen at [facility] ER and was told he has hemorrhoids. Pt states he had same episode before and required surgery,

## 2023-05-27 NOTE — Assessment & Plan Note (Signed)
-   This is associated with perianal fistula. - Management as above.

## 2023-05-27 NOTE — ED Notes (Signed)
Pt placed on cardiac monitor Duplicate orders removed from system Lab collected all additional blood orders 2000cc LR running

## 2023-05-27 NOTE — Progress Notes (Signed)
   05/27/23 2140  Assess: MEWS Score  Level of Consciousness Alert  Assess: MEWS Score  MEWS Temp 0  MEWS Systolic 0  MEWS Pulse 2  MEWS RR 0  MEWS LOC 0  MEWS Score 2  MEWS Score Color Yellow  Assess: if the MEWS score is Yellow or Red  Were vital signs accurate and taken at a resting state? No, vital signs rechecked  Does the patient meet 2 or more of the SIRS criteria? No  MEWS guidelines implemented  Yes, yellow  Treat  MEWS Interventions Considered administering scheduled or prn medications/treatments as ordered  Take Vital Signs  Increase Vital Sign Frequency  Yellow: Q2hr x1, continue Q4hrs until patient remains green for 12hrs  Escalate  MEWS: Escalate Yellow: Discuss with charge nurse and consider notifying provider and/or RRT  Notify: Charge Nurse/RN  Name of Charge Nurse/RN Notified Rosalyn Charters  Provider Notification  Provider Name/Title Dr. Arville Care  Date Provider Notified 05/27/23  Time Provider Notified 2145  Method of Notification Page  Notification Reason Other (Comment) (yellow mews)  Provider response See new orders  Date of Provider Response 05/27/23

## 2023-05-27 NOTE — ED Notes (Signed)
Called to give report to 58 Spoke to [place], chart needs to be reviewed

## 2023-05-27 NOTE — H&P (Signed)
[place]   PATIENT NAME: Tim Tucker    MR#:  [id number]  DATE OF BIRTH:  1991-03-03  DATE OF ADMISSION:  05/27/2023  PRIMARY CARE PHYSICIAN: Toma Deiters, MD   Patient is coming from: Home  REQUESTING/REFERRING PHYSICIAN: Judithann Sheen, PA   CHIEF COMPLAINT:   Chief Complaint  Patient presents with   Rectal Bleeding    HISTORY OF PRESENT ILLNESS:  Tim Tucker is a 32 y.o. male with medical history significant for essential hypertension and hidradenitis suppurativa with previous history of gluteal abscesses and anal fistula, presented to the emergency room with acute onset of right gluteal worsening swelling over the last few days that started on Thursday with associated pain with occasional bleeding while he was wiping with bowel movements.  No fever or chills.  No nausea or vomiting or abdominal pain.  No chest pain or palpitations.  No cough or wheezing or dyspnea.  He was seen at [facility] ED for similar symptoms but was provided with hydrocortisone suppositories.  He went home and has been doing sitz bath but his pain has been worsening.  His last bowel movement was today at 12 noon and it was normal.  No bright red bleeding per rectum or melena.  He took ibuprofen at noon today without relief.  ED Course: Upon presentation to the emergency room, BP was 148/101 with otherwise normal vital signs.  Labs revealed unremarkable CMP.  Lactic acid 0.8 and CBC showed leukocytosis 16.7 with neutrophilia.  Coag profile was within normal.  Blood cultures were drawn. EKG as reviewed by me : None Imaging: MRI of the pelvis showed the following: 1. 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 2. Focal rim enhancing 2.9 x 2.4 cm abscess in the midline. Significant surrounding changes of cellulitis involving the skin  and subcutaneous fat. 3. No significant intrapelvic findings.  The patient was ordered IV Zosyn.  General surgery consult was obtained and recommendation was for IV antibiotics for now pending expected I&D in AM..  PAST MEDICAL HISTORY:  -Essential hypertension -Hidradenitis suppurativa  PAST SURGICAL HISTORY:   Past Surgical History:  Procedure Laterality Date   APPENDECTOMY     INCISION AND DRAINAGE PERIRECTAL ABSCESS N/A 04/19/2017   Procedure: IRRIGATION AND DEBRIDEMENT PERIRECTAL ABSCESS;  Surgeon: Ovidio Kin, MD;  Location: [facility] ORS;  Service: General;  Laterality: N/A;    SOCIAL HISTORY:   Social History   Tobacco Use   Smoking status: Never   Smokeless tobacco: Never  Substance Use Topics   Alcohol use: No    FAMILY HISTORY:   Family History  Problem Relation Age of Onset   Hypertension Mother    Heart failure Mother    Hypertension Father    Heart failure Father     DRUG ALLERGIES:   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 All pertinent systems were reviewed above. Constitutional, HEENT, cardiovascular, respiratory, GI, GU, musculoskeletal, neuro, psychiatric, endocrine, integumentary and hematologic systems were reviewed and are otherwise negative/unremarkable except for positive findings mentioned above in the HPI.   MEDICATIONS AT HOME:   Prior to Admission medications   Medication Sig Start Date End Date Taking? Authorizing Provider  calcium carbonate (TUMS  EX) 750 MG chewable tablet Chew 1 tablet by mouth daily.   Yes [provider]  ibuprofen (ADVIL) 200 MG tablet Take 200 mg by mouth every 6 (six) hours as needed for mild pain (pain score 1-3).   Yes [provider]      VITAL SIGNS:  Blood pressure (!) 154/98, pulse (!) 107, temperature 99.6 F (37.6 C), temperature source Oral, resp. rate 19,  height 5\' 9"  (1.753 m), weight 107 kg, SpO2 98%.  PHYSICAL EXAMINATION:  Physical Exam  GENERAL:  32 y.o.-year-old male patient lying in the bed with no acute distress.  EYES: Pupils equal, round, reactive to light and accommodation. No scleral icterus. Extraocular muscles intact.  HEENT: Head atraumatic, normocephalic. Oropharynx and nasopharynx clear.  NECK:  Supple, no jugular venous distention. No thyroid enlargement, no tenderness.  LUNGS: Normal breath sounds bilaterally, no wheezing, rales,rhonchi or crepitation. No use of accessory muscles of respiration.  CARDIOVASCULAR: Regular rate and rhythm, S1, S2 normal. No murmurs, rubs, or gallops.  ABDOMEN: Soft, nondistended, nontender. Bowel sounds present. No organomegaly or mass.  EXTREMITIES: No pedal edema, cyanosis, or clubbing.  NEUROLOGIC: Cranial nerves II through XII are intact. Muscle strength 5/5 in all extremities. Sensation intact. Gait not checked.  PSYCHIATRIC: The patient is alert and oriented x 3.  Normal affect and good eye contact. SKIN: Right warm, erythematous and tender gluteal swelling as shown below with significant fluctuation.   LABORATORY PANEL:   CBC Recent Labs  Lab 05/27/23 1653  WBC 16.7*  HGB 15.3  HCT 45.8  PLT 365   ------------------------------------------------------------------------------------------------------------------  Chemistries  Recent Labs  Lab 05/27/23 1653  NA 135  K 3.7  CL 99  CO2 26  GLUCOSE 102*  BUN 21*  CREATININE 0.83  CALCIUM 8.9  AST 15  ALT 24  ALKPHOS 82  BILITOT 0.7   ------------------------------------------------------------------------------------------------------------------  Cardiac Enzymes No results for input(s): "TROPONINI" in the last 168 hours. ------------------------------------------------------------------------------------------------------------------  RADIOLOGY:  MR PELVIS W WO CONTRAST  Result Date: 05/27/2023 CLINICAL DATA:   Inflammation of the right buttock region. Patient has a history of perianal fistula and perianal abscess. EXAM: MRI PELVIS WITHOUT AND WITH CONTRAST TECHNIQUE: Multiplanar multisequence MR imaging of the pelvis was performed both before and after administration of intravenous contrast. CONTRAST:  10mL GADAVIST GADOBUTROL 1 MMOL/ML IV SOLN COMPARISON:  Prior pelvic CT scan from 2019 is not available for direct comparison. FINDINGS: Urinary Tract: The bladder is unremarkable. No bladder mass or calculi. Bowel: The rectum, visualized sigmoid colon and pelvic small bowel loops are grossly normal. Vascular/Lymphatic: No vascular abnormalities. Small bilateral inguinal lymph nodes. No pelvic adenopathy. Reproductive:  The prostate gland and seminal vesicles are. 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 There is a focal rim enhancing 2.9 x 2.4 cm abscess in the midline. Significant surrounding changes of cellulitis involving the skin and subcutaneous fat. Musculoskeletal: No significant bony findings. IMPRESSION: 1. 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 2. Focal rim enhancing 2.9 x 2.4 cm abscess in the midline. Significant surrounding changes of cellulitis involving the skin and subcutaneous fat. 3. No significant intrapelvic findings. Electronically Signed   By: Rudie Meyer M.D.   On: 05/27/2023 18:38  IMPRESSION AND PLAN:  Assessment and Plan: * Sepsis due to cellulitis [facility]) - The patient be admitted to a medical telemetry bed. - This is clearly secondary to right gluteal cellulitis with perianal fistula with history of hidradenitis suppurativa. - Sepsis manifested by leukocytosis and  tachycardia. - Will continue antibiotic therapy with IV Zosyn for now. 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 - Pain management will be provided. - General Surgery consult will be obtained.  Dr. Robyne Peers was notified - Will keep him n.p.o. after midnight.  Abscess, gluteal, right - This is associated with perianal fistula. - Management as above.  Essential hypertension - Will be placed on as needed IV labetalol. - Will start him on HCTZ. -Adequate pain management should help with his BP.    DVT prophylaxis: Lovenox.  Advanced Care Planning:  Code Status: full code.  Family Communication:  The plan of care was discussed in details with the patient (and family). I answered all questions. The patient agreed to proceed with the above mentioned plan. Further management will depend upon hospital course. Disposition Plan: Back to previous home environment Consults called: General Surgery All the records are reviewed and case discussed with ED provider.  Status is: Inpatient   At the time of the admission, it appears that the appropriate admission status for this patient is inpatient.  This is judged to be reasonable and necessary in order to provide the required intensity of service to ensure the patient's safety given the presenting symptoms, physical exam findings and initial radiographic and laboratory data in the context of comorbid conditions.  The patient requires inpatient status due to high intensity of service, high risk of further deterioration and high frequency of surveillance required.  I certify that at the time of admission, it is my clinical judgment that the patient will require inpatient hospital care extending more than 2 midnights.                            Dispo: The patient is from: Home              Anticipated d/c is to: Home              Patient currently is not medically stable to d/c.              Difficult to place patient: No  Hannah Beat M.D on 05/27/2023 at 9:02 PM  [facility]   From 7 PM-7 AM, contact night-coverage [email or web address]  CC: Primary care physician; Toma Deiters, MD

## 2023-05-27 NOTE — ED Notes (Signed)
Pt returned from MRI  Reattached to partial monitor   Informed by MRI that pt vomited during exam and also bled from bottom

## 2023-05-27 NOTE — ED Notes (Signed)
Pt has perineal hidradenitis suppurativa 5 surgeries in the past Last surgery in 2019 Pt still able to pass stool Pt has blood in stool

## 2023-05-27 NOTE — Progress Notes (Signed)
Pharmacy Antibiotic Note  Tim Tucker is a 32 y.o. male admitted on 05/27/2023 with cellulitis.  Patient presented to ED for evaluation of rectal bleeding and rectal pain that started Thursday.  He reports that he notices blood with wiping and in the toilet. He reports that the last time this happened he had I&D for perirectal abscess and symptoms feel similar. Pharmacy has been consulted for Zosyn dosing.  Plan: Initiate patient on Zosyn 3.375 g IV q8h   Height: 5\' 9"  (175.3 cm) Weight: 107 kg (236 lb) IBW/kg (Calculated) : 70.7  Temp (24hrs), Avg:99.1 F (37.3 C), Min:98.6 F (37 C), Max:99.6 F (37.6 C)  Recent Labs  Lab 05/27/23 1653  WBC 16.7*  CREATININE 0.83    Estimated Creatinine Clearance: 154 mL/min (by C-G formula based on SCr of 0.83 mg/dL).    Allergies  Allergen Reactions   Vancomycin Itching    Antimicrobials this admission: 11/25 Zosyn >>    Dose adjustments this admission: None  Microbiology results: None  Thank you for allowing pharmacy to be a part of this patient's care.  Merryl Hacker, PharmD Clinical Pharmacist 05/27/2023 7:46 PM

## 2023-05-27 NOTE — ED Provider Notes (Signed)
[place] EMERGENCY DEPARTMENT AT [facility] Provider Note   CSN: [id number] Arrival date & time: 05/27/23  1346     History  Chief Complaint  Patient presents with   Rectal Bleeding    Tim Tucker is a 32 y.o. male with past medical history of seizure disorder (on keppra), hidradenitis suppurativa, anal fistula, HTN presents to emergency department for evaluation of rectal bleeding and rectal pain that started Thursday.  He reports that he notices blood with wiping and in the toilet. He reports that the last time this happened he had I&D for perirectal abscess and symptoms feel similar.  He was seen at [facility] ED for similar complaints but was provided with hydrocortisone suppository for pain.  He reports following, he went to go home for sitz bath but has significant pain with sitting on bottom.  His last bowel movement was today at 12 and normal.  He took ibuprofen at noon today without relief. He denies fevers, abdominal pain.   Rectal Bleeding Associated symptoms: no abdominal pain, no dizziness, no fever, no light-headedness and no vomiting       Home Medications Prior to Admission medications   Medication Sig Start Date End Date Taking? Authorizing Provider  calcium carbonate (TUMS EX) 750 MG chewable tablet Chew 1 tablet by mouth daily.   Yes [provider]  ibuprofen (ADVIL) 200 MG tablet Take 200 mg by mouth every 6 (six) hours as needed for mild pain (pain score 1-3).   Yes [provider]      Allergies    Vancomycin    Review of Systems   Review of Systems  Constitutional:  Negative for chills, fatigue and fever.  Respiratory:  Negative for cough, chest tightness, shortness of breath and wheezing.   Cardiovascular:  Negative for chest pain and palpitations.  Gastrointestinal:  Positive for hematochezia and rectal pain. Negative for abdominal pain, constipation, diarrhea, nausea and vomiting.  Neurological:  Negative for  dizziness, seizures, weakness, light-headedness, numbness and headaches.    Physical Exam Updated Vital Signs BP (!) 149/98   Pulse (!) 103   Temp 99.6 F (37.6 C) (Oral)   Resp 18   Ht 5\' 9"  (1.753 m)   Wt 107 kg   SpO2 97%   BMI 34.85 kg/m  Physical Exam Vitals and nursing note reviewed.  Constitutional:      General: He is not in acute distress.    Appearance: Normal appearance. He is well-developed.  HENT:     Head: Normocephalic and atraumatic.  Eyes:     Conjunctiva/sclera: Conjunctivae normal.  Cardiovascular:     Rate and Rhythm: Regular rhythm. Tachycardia present.     Heart sounds: No murmur heard. Pulmonary:     Effort: Pulmonary effort is normal. No respiratory distress.     Breath sounds: Normal breath sounds.  Chest:     Chest wall: No tenderness.  Abdominal:     General: There is no distension.     Palpations: Abdomen is soft.     Tenderness: There is no abdominal tenderness. There is no guarding or rebound.  Genitourinary:    Comments: No gross blood nor external hemorrhoids appreciated Peri rectum warm mass without fluctuance on right buttocks Musculoskeletal:        General: No swelling.     Cervical back: Neck supple.     Right lower leg: No edema.     Left lower leg: No edema.  Skin:    General: Skin  is warm and dry.     Capillary Refill: Capillary refill takes less than 2 seconds.     Coloration: Skin is not jaundiced or pale.  Neurological:     Mental Status: He is alert and oriented to person, place, and time. Mental status is at baseline.     Motor: No weakness.     Coordination: Coordination normal.     Gait: Gait normal.  Psychiatric:        Mood and Affect: Mood normal.   Randon Goldsmith RN chaperoned GU exam  ED Results / Procedures / Treatments   Labs (all labs ordered are listed, but only abnormal results are displayed) Labs Reviewed  CBC WITH DIFFERENTIAL/PLATELET - Abnormal; Notable for the following components:       Result Value   WBC 16.7 (*)    Neutro Abs 12.7 (*)    Abs Immature Granulocytes 0.16 (*)    All other components within normal limits  COMPREHENSIVE METABOLIC PANEL - Abnormal; Notable for the following components:   Glucose, Bld 102 (*)    BUN 21 (*)    All other components within normal limits  POC OCCULT BLOOD, ED - Normal  CULTURE, BLOOD (ROUTINE X 2)  CULTURE, BLOOD (ROUTINE X 2)  LACTIC ACID, PLASMA  LACTIC ACID, PLASMA  PROTIME-INR  APTT  HIV ANTIBODY (ROUTINE TESTING W REFLEX)  PROTIME-INR  CORTISOL-AM, BLOOD  PROCALCITONIN    EKG None  Radiology MR PELVIS W WO CONTRAST  Result Date: 05/27/2023 CLINICAL DATA:  Inflammation of the right buttock region. Patient has a history of perianal fistula and perianal abscess. EXAM: MRI PELVIS WITHOUT AND WITH CONTRAST TECHNIQUE: Multiplanar multisequence MR imaging of the pelvis was performed both before and after administration of intravenous contrast. CONTRAST:  10mL GADAVIST GADOBUTROL 1 MMOL/ML IV SOLN COMPARISON:  Prior pelvic CT scan from 2019 is not available for direct comparison. FINDINGS: Urinary Tract: The bladder is unremarkable. No bladder mass or calculi. Bowel: The rectum, visualized sigmoid colon and pelvic small bowel loops are grossly normal. Vascular/Lymphatic: No vascular abnormalities. Small bilateral inguinal lymph nodes. No pelvic adenopathy. Reproductive:  The prostate gland and seminal vesicles are. 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 There is a focal rim enhancing 2.9 x 2.4 cm abscess in the midline. Significant surrounding changes of cellulitis involving the skin and subcutaneous fat. Musculoskeletal: No significant bony findings. IMPRESSION: 1. 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 2. Focal rim enhancing 2.9 x 2.4 cm abscess in the midline. Significant surrounding changes of cellulitis involving the skin and subcutaneous fat. 3. No significant intrapelvic findings. Electronically Signed   By: Rudie Meyer M.D.   On: 05/27/2023 18:38    Procedures Procedures    Medications Ordered in ED Medications  piperacillin-tazobactam (ZOSYN) IVPB 3.375 g (has no administration in time range)  lactated ringers infusion (has no administration in time range)  lactated ringers bolus 1,000 mL (1,000 mLs Intravenous New Bag/Given 05/27/23 2007)    And  lactated ringers bolus 1,000 mL (1,000 mLs Intravenous New Bag/Given 05/27/23 2009)    And  lactated ringers bolus 1,000 mL (has no administration in time range)    And  lactated ringers bolus 500 mL (  has no administration in time range)  enoxaparin (LOVENOX) injection 50 mg (has no administration in time range)  acetaminophen (TYLENOL) tablet 650 mg (has no administration in time range)    Or  acetaminophen (TYLENOL) suppository 650 mg (has no administration in time range)  traZODone (DESYREL) tablet 25 mg (has no administration in time range)  magnesium hydroxide (MILK OF MAGNESIA) suspension 30 mL (has no administration in time range)  ondansetron (ZOFRAN) tablet 4 mg (has no administration in time range)    Or  ondansetron (ZOFRAN) injection 4 mg (has no administration in time range)  acetaminophen (TYLENOL) tablet 650 mg (650 mg Oral Given 05/27/23 1629)  gadobutrol (GADAVIST) 1 MMOL/ML injection 10 mL (10 mLs Intravenous Contrast Given 05/27/23 1758)  HYDROmorphone (DILAUDID) injection 0.5 mg (0.5 mg Intravenous Given 05/27/23 1943)    ED Course/ Medical Decision Making/ A&P                                 Medical Decision Making Amount and/or Complexity of Data Reviewed Labs: ordered. Radiology: ordered.  Risk OTC  drugs. Prescription drug management.   Patient presents to the ED for concern of rectal bleeding , this involves an extensive number of treatment options, and is a complaint that carries with it a high risk of complications and morbidity.  The differential diagnosis includes abscess, cellulitis, hemorrhoids. This is not an exhaustive list   Co morbidities that complicate the patient evaluation  seizure disorder (on keppra), hidradenitis suppurativa, anal fistula, HTN, red man syndrome (2022)   Additional history obtained:  Additional history obtained from Nursing and Outside Medical Records   External records from outside source obtained and reviewed including RN triage note   Lab Tests:  I Ordered, and personally interpreted labs.  The pertinent results include: Leukocytosis of 16.7 Lactic acid WNL     Imaging Studies ordered:  I ordered imaging studies including MRI Pelvis with con I independently visualized and interpreted imaging which showed  1. 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 2. Focal rim enhancing 2.9 x 2.4 cm abscess in the midline. Significant surrounding changes of cellulitis involving the skin and subcutaneous fat.  I agree with the radiologist interpretation    Medicines ordered and prescription drug management:  I ordered medication including Tylenol, Dilaudid, Zosyn, vanc for pain management and antibiotics for infection  Reevaluation of the patient after these medicines showed that the patient improved I have reviewed the patients home medicines and have made adjustments as needed   Consultations Obtained:  I requested consultation with general surgery (Dr. Robyne Peers),  and discussed lab and imaging findings as well as pertinent plan - they recommend: hospitalist admission and Zosyn. NPO at  midnight for sx tomorrow I requested consultation with hospitalist,  and discussed lab and imaging findings as well as pertinent plan - Dr. Arville Care accepts pt for admission   Problem List / ED Course:  Sepsis Cellulitis   Reevaluation:  After the interventions noted above, I reevaluated the patient and found that they have :improved   Dispostion:  After consideration of the diagnostic results and the patients response to treatment, I feel that the patent would benefit from hospitalist admission and surgery tomorrow.  Discussed disposition with patient expressed understanding agrees with plan.  All questions answered to her  satisfaction.        Final Clinical Impression(s) / ED Diagnoses Final diagnoses:  Sepsis, due to unspecified organism, unspecified whether acute organ dysfunction present [facility])  Cellulitis, unspecified cellulitis site    Rx / DC Orders ED Discharge Orders     None         Judithann Sheen, PA 05/27/23 2043    Lonell Grandchild, MD 05/27/23 2306

## 2023-05-27 NOTE — ED Notes (Signed)
Pt wearing only gown and ED socks All belongings in bag Waiting for OR staff or admission staff to take pt

## 2023-05-27 NOTE — Assessment & Plan Note (Addendum)
-   The patient be admitted to a medical telemetry bed. - This is clearly secondary to right gluteal cellulitis with perianal fistula with history of hidradenitis suppurativa. - Sepsis manifested by leukocytosis and tachycardia. - Will continue antibiotic therapy with IV Zosyn for now. 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 - Pain management will be provided. - General Surgery consult will be obtained.  Dr. Robyne Peers was notified - Will keep him n.p.o. after midnight.

## 2023-05-27 NOTE — Plan of Care (Signed)

## 2023-05-27 NOTE — Assessment & Plan Note (Signed)
-   Will be placed on as needed IV labetalol. - Will start him on HCTZ. -Adequate pain management should help with his BP.

## 2023-05-27 NOTE — ED Notes (Signed)
Standby for exam Pt has large mass to RIGHT buttocks No hemorrhoids noted  MD did occult stool Negative

## 2023-05-28 ENCOUNTER — Inpatient Hospital Stay (HOSPITAL_COMMUNITY): Payer: No Typology Code available for payment source | Admitting: Certified Registered"

## 2023-05-28 ENCOUNTER — Encounter (HOSPITAL_COMMUNITY): Payer: Self-pay | Admitting: Family Medicine

## 2023-05-28 ENCOUNTER — Other Ambulatory Visit: Payer: Self-pay

## 2023-05-28 ENCOUNTER — Encounter (HOSPITAL_COMMUNITY)
Admission: EM | Disposition: A | Discharge status: Home / Self Care | Payer: Self-pay | Source: Home / Self Care | Attending: Internal Medicine

## 2023-05-28 DIAGNOSIS — A419 Sepsis, unspecified organism: Secondary | ICD-10-CM

## 2023-05-28 DIAGNOSIS — K603 Anal fistula, unspecified: Secondary | ICD-10-CM | POA: Diagnosis not present

## 2023-05-28 DIAGNOSIS — K611 Rectal abscess: Secondary | ICD-10-CM

## 2023-05-28 DIAGNOSIS — L0390 Cellulitis, unspecified: Secondary | ICD-10-CM

## 2023-05-28 DIAGNOSIS — L0231 Cutaneous abscess of buttock: Secondary | ICD-10-CM

## 2023-05-28 DIAGNOSIS — I10 Essential (primary) hypertension: Secondary | ICD-10-CM | POA: Diagnosis not present

## 2023-05-28 HISTORY — PX: INCISION AND DRAINAGE ABSCESS: SHX5864

## 2023-05-28 LAB — CBC
HCT: 40.8 % (ref 39.0–52.0)
Hemoglobin: 13.5 g/dL (ref 13.0–17.0)
MCH: 28.1 pg (ref 26.0–34.0)
MCHC: 33.1 g/dL (ref 30.0–36.0)
MCV: 84.8 fL (ref 80.0–100.0)
Platelets: 317 10*3/uL (ref 150–400)
RBC: 4.81 MIL/uL (ref 4.22–5.81)
RDW: 12.4 % (ref 11.5–15.5)
WBC: 14.1 10*3/uL — ABNORMAL HIGH (ref 4.0–10.5)
nRBC: 0 % (ref 0.0–0.2)

## 2023-05-28 LAB — CORTISOL-AM, BLOOD: Cortisol - AM: 3.6 ug/dL — ABNORMAL LOW (ref 6.7–22.6)

## 2023-05-28 LAB — HIV ANTIBODY (ROUTINE TESTING W REFLEX): HIV Screen 4th Generation wRfx: NONREACTIVE

## 2023-05-28 LAB — PROCALCITONIN: Procalcitonin: <0.1 ng/mL

## 2023-05-28 LAB — SURGICAL PCR SCREEN
MRSA, PCR: NEGATIVE
Staphylococcus aureus: NEGATIVE

## 2023-05-28 LAB — PROTIME-INR
INR: 1.1 (ref 0.8–1.2)
Prothrombin Time: 13.9 s (ref 11.4–15.2)

## 2023-05-28 SURGERY — INCISION AND DRAINAGE, ABSCESS
Anesthesia: General | Site: Anus

## 2023-05-28 MED ORDER — FENTANYL CITRATE (PF) 100 MCG/2ML IJ SOLN
INTRAMUSCULAR | Status: AC
  Filled 2023-05-28: qty 2

## 2023-05-28 MED ORDER — PROPOFOL 10 MG/ML IV BOLUS
INTRAVENOUS | Status: AC
  Filled 2023-05-28: qty 20

## 2023-05-28 MED ORDER — OXYCODONE HCL 5 MG PO TABS
5.0000 mg | ORAL_TABLET | ORAL | Status: DC | PRN
  Administered 2023-05-29 – 2023-05-30 (×6): 5 mg via ORAL
  Filled 2023-05-28 (×6): qty 1

## 2023-05-28 MED ORDER — FENTANYL CITRATE PF 50 MCG/ML IJ SOSY: 25.0000 ug | PREFILLED_SYRINGE | INTRAMUSCULAR | Status: DC | PRN

## 2023-05-28 MED ORDER — CHLORHEXIDINE GLUCONATE CLOTH 2 % EX PADS
6.0000 | MEDICATED_PAD | Freq: Once | CUTANEOUS | Status: DC
Start: 2023-05-28 — End: 2023-05-28

## 2023-05-28 MED ORDER — SODIUM CHLORIDE 0.9 % IR SOLN
Status: DC | PRN
  Administered 2023-05-28: 1000 mL

## 2023-05-28 MED ORDER — DEXAMETHASONE SODIUM PHOSPHATE 10 MG/ML IJ SOLN
INTRAMUSCULAR | Status: DC | PRN
  Administered 2023-05-28: 10 mg via INTRAVENOUS

## 2023-05-28 MED ORDER — FENTANYL CITRATE (PF) 100 MCG/2ML IJ SOLN
INTRAMUSCULAR | Status: DC | PRN
  Administered 2023-05-28: 25 ug via INTRAVENOUS
  Administered 2023-05-28 (×2): 50 ug via INTRAVENOUS
  Administered 2023-05-28: 25 ug via INTRAVENOUS

## 2023-05-28 MED ORDER — METOPROLOL TARTRATE 5 MG/5ML IV SOLN
INTRAVENOUS | Status: AC
  Filled 2023-05-28: qty 5

## 2023-05-28 MED ORDER — SIMETHICONE 80 MG PO CHEW
80.0000 mg | CHEWABLE_TABLET | Freq: Four times a day (QID) | ORAL | Status: DC | PRN
  Filled 2023-05-28: qty 1

## 2023-05-28 MED ORDER — HYDROGEN PEROXIDE 3 % EX SOLN
CUTANEOUS | Status: DC | PRN
  Administered 2023-05-28: 1

## 2023-05-28 MED ORDER — DEXAMETHASONE SODIUM PHOSPHATE 10 MG/ML IJ SOLN
INTRAMUSCULAR | Status: AC
  Filled 2023-05-28: qty 1

## 2023-05-28 MED ORDER — OXYCODONE HCL 5 MG PO TABS: 5.0000 mg | ORAL_TABLET | Freq: Once | ORAL | Status: DC | PRN

## 2023-05-28 MED ORDER — LIDOCAINE VISCOUS HCL 2 % MT SOLN
OROMUCOSAL | Status: DC | PRN
  Administered 2023-05-28: 1 via OROMUCOSAL

## 2023-05-28 MED ORDER — DEXMEDETOMIDINE HCL IN NACL 80 MCG/20ML IV SOLN
INTRAVENOUS | Status: AC
  Filled 2023-05-28: qty 20

## 2023-05-28 MED ORDER — PROPOFOL 10 MG/ML IV BOLUS
INTRAVENOUS | Status: DC | PRN
  Administered 2023-05-28: 250 mg via INTRAVENOUS
  Administered 2023-05-28: 150 mg via INTRAVENOUS

## 2023-05-28 MED ORDER — ONDANSETRON HCL 4 MG/2ML IJ SOLN
INTRAMUSCULAR | Status: AC
  Filled 2023-05-28: qty 2

## 2023-05-28 MED ORDER — CHLORHEXIDINE GLUCONATE CLOTH 2 % EX PADS
6.0000 | MEDICATED_PAD | Freq: Once | CUTANEOUS | Status: AC
  Administered 2023-05-28: 6 via TOPICAL

## 2023-05-28 MED ORDER — ACETAMINOPHEN 500 MG PO TABS
1000.0000 mg | ORAL_TABLET | Freq: Four times a day (QID) | ORAL | Status: DC
  Administered 2023-05-28 – 2023-05-30 (×8): 1000 mg via ORAL
  Filled 2023-05-28 (×8): qty 2

## 2023-05-28 MED ORDER — BUPIVACAINE HCL (PF) 0.5 % IJ SOLN
INTRAMUSCULAR | Status: AC
  Filled 2023-05-28: qty 30

## 2023-05-28 MED ORDER — DEXMEDETOMIDINE HCL IN NACL 80 MCG/20ML IV SOLN
INTRAVENOUS | Status: DC | PRN
  Administered 2023-05-28: 20 ug via INTRAVENOUS

## 2023-05-28 MED ORDER — POLYETHYLENE GLYCOL 3350 17 G PO PACK
17.0000 g | PACK | Freq: Every day | ORAL | Status: DC
  Administered 2023-05-28 – 2023-05-29 (×2): 17 g via ORAL
  Filled 2023-05-28 (×3): qty 1

## 2023-05-28 MED ORDER — CHLORHEXIDINE GLUCONATE 0.12 % MT SOLN
15.0000 mL | Freq: Once | OROMUCOSAL | Status: AC
  Administered 2023-05-28: 15 mL via OROMUCOSAL

## 2023-05-28 MED ORDER — MIDAZOLAM HCL 2 MG/2ML IJ SOLN
INTRAMUSCULAR | Status: AC
Start: 2023-05-28 — End: ?
  Filled 2023-05-28: qty 2

## 2023-05-28 MED ORDER — FAMOTIDINE 20 MG PO TABS
20.0000 mg | ORAL_TABLET | Freq: Every day | ORAL | Status: DC
  Administered 2023-05-28 – 2023-05-30 (×3): 20 mg via ORAL
  Filled 2023-05-28 (×4): qty 1

## 2023-05-28 MED ORDER — ORAL CARE MOUTH RINSE: 15.0000 mL | Freq: Once | OROMUCOSAL | Status: AC

## 2023-05-28 MED ORDER — LIDOCAINE VISCOUS HCL 2 % MT SOLN
OROMUCOSAL | Status: AC
  Filled 2023-05-28: qty 15

## 2023-05-28 MED ORDER — SENNOSIDES-DOCUSATE SODIUM 8.6-50 MG PO TABS
1.0000 | ORAL_TABLET | Freq: Two times a day (BID) | ORAL | Status: DC
  Administered 2023-05-28 – 2023-05-30 (×5): 1 via ORAL
  Filled 2023-05-28 (×5): qty 1

## 2023-05-28 MED ORDER — RISAQUAD PO CAPS
1.0000 | ORAL_CAPSULE | Freq: Every day | ORAL | Status: DC
  Administered 2023-05-28 – 2023-05-30 (×3): 1 via ORAL
  Filled 2023-05-28 (×3): qty 1

## 2023-05-28 MED ORDER — METOPROLOL TARTRATE 5 MG/5ML IV SOLN
INTRAVENOUS | Status: DC | PRN
  Administered 2023-05-28: 2 mg via INTRAVENOUS

## 2023-05-28 MED ORDER — BUPIVACAINE HCL (PF) 0.5 % IJ SOLN
INTRAMUSCULAR | Status: DC | PRN
  Administered 2023-05-28: 30 mL

## 2023-05-28 MED ORDER — MIDAZOLAM HCL 2 MG/2ML IJ SOLN
INTRAMUSCULAR | Status: DC | PRN
  Administered 2023-05-28: 2 mg via INTRAVENOUS

## 2023-05-28 MED ORDER — LIDOCAINE HCL (CARDIAC) PF 100 MG/5ML IV SOSY
PREFILLED_SYRINGE | INTRAVENOUS | Status: DC | PRN
  Administered 2023-05-28: 100 mg via INTRAVENOUS

## 2023-05-28 MED ORDER — ROCURONIUM BROMIDE 10 MG/ML (PF) SYRINGE
PREFILLED_SYRINGE | INTRAVENOUS | Status: DC | PRN
  Administered 2023-05-28: 50 mg via INTRAVENOUS

## 2023-05-28 MED ORDER — OXYCODONE HCL 5 MG/5ML PO SOLN: 5.0000 mg | Freq: Once | ORAL | Status: DC | PRN

## 2023-05-28 MED ORDER — SUGAMMADEX SODIUM 200 MG/2ML IV SOLN
INTRAVENOUS | Status: DC | PRN
  Administered 2023-05-28: 200 mg via INTRAVENOUS

## 2023-05-28 SURGICAL SUPPLY — 36 items
BLADE SURG SZ11 CARB STEEL (BLADE) IMPLANT
BNDG CONFORM 2 STRL LF (GAUZE/BANDAGES/DRESSINGS) ×1 IMPLANT
CANNULA VESSEL 3MM 2 BLNT TIP (CANNULA) IMPLANT
CLOTH BEACON ORANGE TIMEOUT ST (SAFETY) ×1 IMPLANT
COVER LIGHT HANDLE STERIS (MISCELLANEOUS) ×2 IMPLANT
DRAIN PENROSE 0.75X12 (DRAIN) IMPLANT
ELECT REM PT RETURN 9FT ADLT (ELECTROSURGICAL) ×1
ELECTRODE REM PT RTRN 9FT ADLT (ELECTROSURGICAL) ×1 IMPLANT
GAUZE PACKING IODOFORM 1/4X15 (PACKING) IMPLANT
GAUZE SPONGE 4X4 12PLY STRL (GAUZE/BANDAGES/DRESSINGS) ×2 IMPLANT
GLOVE BIOGEL PI IND STRL 6.5 (GLOVE) ×1 IMPLANT
GLOVE BIOGEL PI IND STRL 7.0 (GLOVE) ×2 IMPLANT
GLOVE SURG SS PI 6.5 STRL IVOR (GLOVE) ×1 IMPLANT
GOWN STRL REUS W/TWL LRG LVL3 (GOWN DISPOSABLE) ×2 IMPLANT
KIT TURNOVER KIT A (KITS) ×1 IMPLANT
MANIFOLD NEPTUNE II (INSTRUMENTS) ×1 IMPLANT
NDL HYPO 21X1.5 SAFETY (NEEDLE) IMPLANT
NEEDLE HYPO 21X1.5 SAFETY (NEEDLE) ×2 IMPLANT
NS IRRIG 1000ML POUR BTL (IV SOLUTION) ×1 IMPLANT
PACK BASIC LIMB (CUSTOM PROCEDURE TRAY) IMPLANT
PACK MINOR (CUSTOM PROCEDURE TRAY) ×1 IMPLANT
PAD ABD 5X9 TENDERSORB (GAUZE/BANDAGES/DRESSINGS) IMPLANT
PAD ARMBOARD 7.5X6 YLW CONV (MISCELLANEOUS) ×1 IMPLANT
POSITIONER HEAD 8X9X4 ADT (SOFTGOODS) ×1 IMPLANT
SET BASIN LINEN APH (SET/KITS/TRAYS/PACK) ×1 IMPLANT
SOL PREP PROV IODINE SCRUB 4OZ (MISCELLANEOUS) ×1 IMPLANT
SPONGE SURGIFOAM ABS GEL 100 (HEMOSTASIS) IMPLANT
SPONGE T-LAP 4X18 ~~LOC~~+RFID (SPONGE) IMPLANT
SURGILUBE 2OZ TUBE FLIPTOP (MISCELLANEOUS) IMPLANT
SUT SILK 3-0 FS1 18XBRD (SUTURE) IMPLANT
SWAB CULTURE ESWAB REG 1ML (MISCELLANEOUS) IMPLANT
SWAB CULTURE LIQ STUART DBL (MISCELLANEOUS) IMPLANT
SYR 20ML LL LF (SYRINGE) IMPLANT
SYR 30ML LL (SYRINGE) IMPLANT
SYR BULB IRRIG 60ML STRL (SYRINGE) ×1 IMPLANT
SYR CONTROL 10ML LL (SYRINGE) IMPLANT

## 2023-05-28 NOTE — Progress Notes (Signed)
PROGRESS NOTE   Tim Tucker  ZOX:[id number] DOB: 06-06-1991 DOA: 05/27/2023 PCP: Toma Deiters, MD   Chief Complaint  Patient presents with   Rectal Bleeding   Level of care: Telemetry  Brief Admission History:  32 y.o. male with medical history significant for essential hypertension and hidradenitis suppurativa with previous history of gluteal abscesses and anal fistula, presented to the emergency room with acute onset of right gluteal worsening swelling over the last few days that started on Thursday with associated pain with occasional bleeding while he was wiping with bowel movements.  No fever or chills.  No nausea or vomiting or abdominal pain.  No chest pain or palpitations.  No cough or wheezing or dyspnea.  He was seen at [facility] ED for similar symptoms but was provided with hydrocortisone suppositories.  He went home and has been doing sitz bath but his pain has been worsening.  His last bowel movement was today at 12 noon and it was normal.  No bright red bleeding per rectum or melena.  He took ibuprofen at noon today without relief.   ED Course: Upon presentation to the emergency room, BP was 148/101 with otherwise normal vital signs.  Labs revealed unremarkable CMP.  Lactic acid 0.8 and CBC showed leukocytosis 16.7 with neutrophilia.  Coag profile was within normal.  Blood cultures were drawn.   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 Focal rim enhancing 2.9 x 2.4 cm abscess in the midline. Significant surrounding changes of cellulitis involving the skin and subcutaneous fat.   The patient was ordered IV Zosyn.  General surgery consult was obtained and recommendation was for IV antibiotics for now pending I&D.   Assessment and Plan:  Sepsis due to cellulitis and abscess   - The patient  be admitted to a medical telemetry bed. 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 - Sepsis manifested by leukocytosis and tachycardia. - Continue antibiotic therapy with IV Zosyn for now. 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 - Pain management and supportive measures ordered  - General Surgery consultation requested.  Dr. Robyne Peers was notified - OR today for I&D, further recommendations to follow  Elevated BP  - suspect this is pain and anxiety response to sepsis/abscess. - monitor BP for now with treatments   Abscess, gluteal, right - This is associated with perianal fistula. - Management as above.  DVT prophylaxis: enoxaparin Code Status: full  Family Communication:  Disposition: anticipate home when ok with surgery team   Consultants:  Surgery  Procedures:  I&D planned for 11/26 Antimicrobials:  Zosyn 11/25>>   Subjective: Pt reports he is eager to have surgical I&D, he has had it done at least 5 previous times.  He denies fever and chills. He reports the smell coming from the abscess has been foul.   Objective: Vitals:   05/28/23 0300 05/28/23 0613 05/28/23 0742 05/28/23 0923  BP: 117/68 127/80 129/81 135/83  Pulse: (!) 110  97 97  Resp: 18  18 17   Temp: 99.3 F (37.4 C)  98.2 F (36.8 C) 98.4 F (36.9 C)  TempSrc: Oral  Oral Oral  SpO2: 96%  99% 98%  Weight:    108.8 kg  Height:    5\' 9"  (1.753 m)    Intake/Output Summary (Last  24 hours) at 05/28/2023 0926 Last data filed at 05/28/2023 0402 Gross per 24 hour  Intake 634.49 ml  Output --  Net 634.49 ml   Filed Weights   05/27/23 1628 05/27/23 2135 05/28/23 0923  Weight: 107 kg 108.8 kg 108.8 kg   Examination:  General exam: Appears calm and comfortable  Respiratory system: Clear to auscultation. Respiratory effort normal. Central nervous system: Alert and oriented. No focal neurological deficits. Extremities: Symmetric  5 x 5 power. Psychiatry: Judgement and insight appear normal. Mood & affect appropriate.   Data Reviewed: I have personally reviewed following labs and imaging studies  CBC: Recent Labs  Lab 05/27/23 1653 05/28/23 0405  WBC 16.7* 14.1*  NEUTROABS 12.7*  --   HGB 15.3 13.5  HCT 45.8 40.8  MCV 83.4 84.8  PLT 365 317    Basic Metabolic Panel: Recent Labs  Lab 05/27/23 1653  NA 135  K 3.7  CL 99  CO2 26  GLUCOSE 102*  BUN 21*  CREATININE 0.83  CALCIUM 8.9    CBG: No results for input(s): "GLUCAP" in the last 168 hours.  Recent Results (from the past 240 hour(s))  Culture, blood (x 2)     Status: None (Preliminary result)   Collection Time: 05/27/23  8:16 PM   Specimen: BLOOD  Result Value Ref Range Status   Specimen Description BLOOD BLOOD LEFT ARM  Final   Special Requests   Final    BOTTLES DRAWN AEROBIC AND ANAEROBIC Blood Culture adequate volume   Culture   Final    NO GROWTH < 12 HOURS Performed at [facility], [address], [place], [place] [address]    Report Status PENDING  Incomplete  Culture, blood (x 2)     Status: None (Preliminary result)   Collection Time: 05/27/23  8:18 PM   Specimen: BLOOD  Result Value Ref Range Status   Specimen Description BLOOD BLOOD LEFT HAND  Final   Special Requests   Final    BOTTLES DRAWN AEROBIC ONLY Blood Culture adequate volume   Culture   Final    NO GROWTH < 12 HOURS Performed at [facility], [address], [place], [place] [address]    Report Status PENDING  Incomplete  Surgical PCR screen     Status: None   Collection Time: 05/27/23 10:49 PM   Specimen: Nasal Mucosa; Nasal Swab  Result Value Ref Range Status   MRSA, PCR NEGATIVE NEGATIVE Final   Staphylococcus aureus NEGATIVE NEGATIVE Final    Comment: (NOTE) The Xpert SA Assay (FDA approved for NASAL specimens in patients 16 years of age and older), is one component of a comprehensive surveillance program. It is not intended to diagnose infection nor  to guide or monitor treatment. Performed at [facility], [address] [place], [place] [address]      Radiology Studies: MR PELVIS W WO CONTRAST  Result Date: 05/27/2023 CLINICAL DATA:  Inflammation of the right buttock region. Patient has a history of perianal fistula and perianal abscess. EXAM: MRI PELVIS WITHOUT AND WITH CONTRAST TECHNIQUE: Multiplanar multisequence MR imaging of the pelvis was performed both before and after administration of intravenous contrast. CONTRAST:  10mL GADAVIST GADOBUTROL 1 MMOL/ML IV SOLN COMPARISON:  Prior pelvic CT scan from 2019 is not available for direct comparison. FINDINGS: Urinary Tract: The bladder is unremarkable. No bladder mass or calculi. Bowel: The rectum, visualized sigmoid colon and pelvic small bowel loops are grossly normal. Vascular/Lymphatic: No vascular abnormalities. Small bilateral inguinal lymph  nodes. No pelvic adenopathy. Reproductive:  The prostate gland and seminal vesicles are. 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 There is a focal rim enhancing 2.9 x 2.4 cm abscess in the midline. Significant surrounding changes of cellulitis involving the skin and subcutaneous fat. Musculoskeletal: No significant bony findings. IMPRESSION: 1. 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 2. Focal rim enhancing 2.9 x 2.4 cm abscess in the midline. Significant surrounding changes of cellulitis involving the skin and subcutaneous fat. 3. No significant intrapelvic findings. Electronically Signed   By: Rudie Meyer M.D.   On: 05/27/2023 18:38    Scheduled Meds:  chlorhexidine  15 mL Mouth/Throat Once   Or   mouth rinse  15 mL Mouth Rinse  Once   [MAR Hold] enoxaparin (LOVENOX) injection  50 mg Subcutaneous Q24H   [MAR Hold] hydrochlorothiazide  12.5 mg Oral Daily   Continuous Infusions:  lactated ringers 150 mL/hr (05/28/23 0402)   [MAR Hold] piperacillin-tazobactam (ZOSYN)  IV 3.375 g (05/28/23 0515)     LOS: 1 day   Time spent: 38 mins  Will Schier Laural Benes, MD How to contact the [facility] Attending or Consulting provider 7A - 7P or covering provider during after hours 7P -7A, for this patient?  Check the care team in [facility] and look for a) attending/consulting [facility] provider listed and b) the [facility] team listed Log into [email or web address] to find provider on call.  Locate the [facility] provider you are looking for under [facility] and page to a number that you can be directly reached. If you still have difficulty reaching the provider, please page the [facility] (Director on Call) for the Hospitalists listed on amion for assistance.  05/28/2023, 9:26 AM

## 2023-05-28 NOTE — Anesthesia Postprocedure Evaluation (Signed)
Anesthesia Post Note  Patient: Tim Tucker  Procedure(s) Performed: INCISION AND DRAINAGE ABSCESS WITH PENROSE DRAIN (Anus) EXAM UNDER ANESTHESIA (Anus)  Patient location during evaluation: PACU Anesthesia Type: General Level of consciousness: awake and alert Pain management: pain level controlled Vital Signs Assessment: post-procedure vital signs reviewed and stable Respiratory status: spontaneous breathing, nonlabored ventilation, respiratory function stable and patient connected to nasal cannula oxygen Cardiovascular status: blood pressure returned to baseline and stable Postop Assessment: no apparent nausea or vomiting Anesthetic complications: no   There were no known notable events for this encounter.   Last Vitals:  Vitals:   05/28/23 1315 05/28/23 1405  BP: 120/76 109/67  Pulse: 88 93  Resp: 20 18  Temp:  37 C  SpO2: 95% 93%    Last Pain:  Vitals:   05/28/23 1405  TempSrc: Oral  PainSc: 0-No pain                 Leandria Thier L Taryn Nave

## 2023-05-28 NOTE — Transfer of Care (Signed)
Immediate Anesthesia Transfer of Care Note  Patient: Tim Tucker  Procedure(s) Performed: INCISION AND DRAINAGE ABSCESS, ANAL EXAM UNDER ANESTHESIA (Anus)  Patient Location: PACU  Anesthesia Type:General  Level of Consciousness: drowsy and patient cooperative  Airway & Oxygen Therapy: Patient Spontanous Breathing and Patient connected to face mask oxygen  Post-op Assessment: Report given to RN and Post -op Vital signs reviewed and stable  Post vital signs: Reviewed and stable  Last Vitals:  Vitals Value Taken Time  BP 107/78 05/28/23 1230  Temp 97.6 05/28/23   1230  Pulse 96 05/28/23 1235  Resp 17 05/28/23 1235  SpO2 92 % 05/28/23 1235  Vitals shown include unfiled device data.  Last Pain:  Vitals:   05/28/23 0923  TempSrc: Oral  PainSc: 7       Patients Stated Pain Goal: 9 (05/28/23 1610)  Complications: No notable events documented.

## 2023-05-28 NOTE — Anesthesia Procedure Notes (Addendum)
Procedure Name: LMA Insertion Date/Time: 05/28/2023 10:50 AM  Performed by: Oletha Cruel, CRNAPre-anesthesia Checklist: Patient identified, Emergency Drugs available, Suction available and Patient being monitored Patient Re-evaluated:Patient Re-evaluated prior to induction Oxygen Delivery Method: Circle system utilized Preoxygenation: Pre-oxygenation with 100% oxygen Induction Type: IV induction Ventilation: Mask ventilation without difficulty LMA: LMA inserted LMA Size: 4.0 Number of attempts: 2 Tube secured with: Tape Dental Injury: Teeth and Oropharynx as per pre-operative assessment  Comments: Huntley Dec, EMT student to present to place LMA. Huntley Dec unable to place LMA #5. Converted to placement of LMA # 4. LMA appeared to "seat" properly. Spontaneously respirations without an audible cuff leak present. Adequate initial tidal volumes. After several minutes, an alteration in patient's ventilation noted. LMA removed. Copious bloody oral secretions noted. Oropharynx suctioned. Patient bag/ mask/ventilated. Plan to convert from LMA to OETT placement.

## 2023-05-28 NOTE — Anesthesia Preprocedure Evaluation (Signed)
Anesthesia Evaluation  Patient identified by MRN, date of birth, ID band Patient awake    Reviewed: Allergy & Precautions, NPO status , Patient's Chart, lab work & pertinent test results  Airway Mallampati: II  TM Distance: >3 FB Neck ROM: Full    Dental no notable dental hx. (+) Dental Advisory Given, Teeth Intact   Pulmonary neg pulmonary ROS   Pulmonary exam normal breath sounds clear to auscultation       Cardiovascular hypertension, Normal cardiovascular exam Rhythm:Regular Rate:Normal     Neuro/Psych negative neurological ROS  negative psych ROS   GI/Hepatic negative GI ROS, Neg liver ROS,,,  Endo/Other  negative endocrine ROS    Renal/GU negative Renal ROS  negative genitourinary   Musculoskeletal negative musculoskeletal ROS (+)    Abdominal   Peds negative pediatric ROS (+)  Hematology negative hematology ROS (+)   Anesthesia Other Findings   Reproductive/Obstetrics negative OB ROS                             Anesthesia Physical Anesthesia Plan  ASA: 2  Anesthesia Plan: General   Post-op Pain Management:    Induction: Intravenous  PONV Risk Score and Plan: 1 and Ondansetron, Dexamethasone, Treatment may vary due to age or medical condition and Midazolam  Airway Management Planned: LMA  Additional Equipment: None  Intra-op Plan:   Post-operative Plan: Extubation in OR  Informed Consent: I have reviewed the patients History and Physical, chart, labs and discussed the procedure including the risks, benefits and alternatives for the proposed anesthesia with the patient or authorized representative who has indicated his/her understanding and acceptance.     Dental advisory given  Plan Discussed with: CRNA  Anesthesia Plan Comments:         Anesthesia Quick Evaluation

## 2023-05-28 NOTE — Consult Note (Signed)
[facility] Consult  Reason for Consult: Perirectal abscess, concern for anal fistula Referring Physician: Sabra Heck, PA  Chief Complaint   Rectal Bleeding     HPI: Tim Tucker is a 32 y.o. male who presented to the hospital with a 3-day history of rectal bleeding and gluteal swelling.  He thought that this was a recurrence of a perirectal abscess that he has had in the past, so he initially presented to his primary care doctor.  His primary care doctor believe this was just a hemorrhoid, and advised the patient to perform sitz bath's.  He failed to improve, so he presented to the emergency department for evaluation.  He denies fevers, chills, abdominal pain, nausea, and vomiting.  He complains of significant pain along his right buttock.  He states that he previously had to have an incision and drainage of a perirectal abscess in the past, but denies any further issues since that time.  He denies ever following up with anybody regarding the possibility of an anal fistula, and he denies ever having had a colonoscopy.  He has a past medical history significant for hypertension, though he is not taking anything for his high blood pressure.  He has a surgical history significant for incision and drainage of perirectal abscess and appendectomy.  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  He was noted to have a leukocytosis of 16.7.  History reviewed. No pertinent past medical history.  Past Surgical History:  Procedure Laterality Date   APPENDECTOMY     INCISION AND DRAINAGE PERIRECTAL ABSCESS N/A 04/19/2017   Procedure: IRRIGATION AND DEBRIDEMENT  PERIRECTAL ABSCESS;  Surgeon: Ovidio Kin, MD;  Location: [facility];  Service: General;  Laterality: N/A;    Family History  Problem Relation Age of Onset   Hypertension Mother    Heart failure Mother    Hypertension Father    Heart failure Father     Social History   Tobacco Use   Smoking status: Never   Smokeless tobacco: Never  Vaping Use   Vaping status: Every Day   Substances: Flavoring  Substance Use Topics   Alcohol use: No   Drug use: No    Medications: I have reviewed the patient's current medications.  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  Blood pressure 135/83, pulse 97, temperature 98.4 F (36.9 C), temperature source Oral, resp. rate 17, height 5\' 9"  (1.753 m), weight 108.8 kg, SpO2 98%. Physical Exam HENT:     Head: Normocephalic and atraumatic.  Eyes:     Extraocular Movements: Extraocular movements intact.     Pupils: Pupils are equal, round, and reactive to light.  Cardiovascular:     Rate and Rhythm: Normal rate.  Pulmonary:     Breath sounds: Normal breath sounds.  Abdominal:     General: There is no distension.     Palpations: Abdomen is soft.     Tenderness: There is no abdominal tenderness.  Genitourinary:    Comments: Significant cellulitic changes with erythema and induration of the right buttock that is  significantly tender to palpation, unable to do complete anal exam secondary to pain Musculoskeletal:        General: Normal range of motion.     Cervical back: Normal range of motion.  Skin:    General: Skin is warm and dry.  Neurological:     General: No focal deficit present.     Mental Status: He is alert and oriented to person, place, and time.  Psychiatric:        Mood and Affect: Mood normal.        Behavior: Behavior normal.     Results: Results for orders placed or performed during the hospital encounter of 05/27/23  (from the past 48 hour(s))  POC occult blood, ED Provider will collect     Status: Normal   Collection Time: 05/27/23  4:13 PM  Result Value Ref Range   Negative    CBC with Differential     Status: Abnormal   Collection Time: 05/27/23  4:53 PM  Result Value Ref Range   WBC 16.7 (H) 4.0 - 10.5 K/uL   RBC 5.49 4.22 - 5.81 MIL/uL   Hemoglobin 15.3 13.0 - 17.0 g/dL   HCT 81.1 91.4 - 78.2 %   MCV 83.4 80.0 - 100.0 fL   MCH 27.9 26.0 - 34.0 pg   MCHC 33.4 30.0 - 36.0 g/dL   RDW 95.6 21.3 - 08.6 %   Platelets 365 150 - 400 K/uL   nRBC 0.0 0.0 - 0.2 %   Neutrophils Relative % 75 %   Neutro Abs 12.7 (H) 1.7 - 7.7 K/uL   Lymphocytes Relative 15 %   Lymphs Abs 2.5 0.7 - 4.0 K/uL   Monocytes Relative 6 %   Monocytes Absolute 0.9 0.1 - 1.0 K/uL   Eosinophils Relative 2 %   Eosinophils Absolute 0.3 0.0 - 0.5 K/uL   Basophils Relative 1 %   Basophils Absolute 0.1 0.0 - 0.1 K/uL   Immature Granulocytes 1 %   Abs Immature Granulocytes 0.16 (H) 0.00 - 0.07 K/uL    Comment: Performed at [facility], [address] [place], [place] [address]  Comprehensive metabolic panel     Status: Abnormal   Collection Time: 05/27/23  4:53 PM  Result Value Ref Range   Sodium 135 135 - 145 mmol/L   Potassium 3.7 3.5 - 5.1 mmol/L   Chloride 99 98 - 111 mmol/L   CO2 26 22 - 32 mmol/L   Glucose, Bld 102 (H) 70 - 99 mg/dL    Comment: Glucose reference range applies only to samples taken after fasting for at least 8 hours.   BUN 21 (H) 6 - 20 mg/dL   Creatinine, Ser 9.62 0.61 - 1.24 mg/dL   Calcium 8.9 8.9 - 95.2 mg/dL   Total Protein 7.5 6.5 - 8.1 g/dL   Albumin 3.8 3.5 - 5.0 g/dL   AST 15 15 - 41 U/L   ALT 24 0 - 44 U/L   Alkaline Phosphatase 82 38 - 126 U/L   Total Bilirubin 0.7 <1.2 mg/dL   GFR, Estimated >84 >13 mL/min    Comment: (NOTE) Calculated using the CKD-EPI Creatinine Equation (2021)    Anion gap 10 5 - 15    Comment: Performed at [facility], [address], [place], [place] [address]   Culture, blood (x 2)     Status: None (Preliminary result)   Collection Time: 05/27/23  8:16 PM   Specimen: BLOOD  Result Value Ref Range  Specimen Description BLOOD BLOOD LEFT ARM    Special Requests      BOTTLES DRAWN AEROBIC AND ANAEROBIC Blood Culture adequate volume   Culture      NO GROWTH < 12 HOURS Performed at [facility], [address], [place], [place] [address]    Report Status PENDING   Lactic acid, plasma     Status: None   Collection Time: 05/27/23  8:16 PM  Result Value Ref Range   Lactic Acid, Venous 0.8 0.5 - 1.9 mmol/L    Comment: Performed at [facility], [address], [place], [place] [address]  Protime-INR     Status: None   Collection Time: 05/27/23  8:16 PM  Result Value Ref Range   Prothrombin Time 13.1 11.4 - 15.2 seconds   INR 1.0 0.8 - 1.2    Comment: (NOTE) INR goal varies based on device and disease states. Performed at [facility], [address] [place], [place] [address]   APTT     Status: None   Collection Time: 05/27/23  8:16 PM  Result Value Ref Range   aPTT 28 24 - 36 seconds    Comment: Performed at [facility], [address], [place], [place] [address]  HIV Antibody (routine testing w rflx)     Status: None   Collection Time: 05/27/23  8:16 PM  Result Value Ref Range   HIV Screen 4th Generation wRfx Non Reactive Non Reactive    Comment: Performed at [facility], [address], [place], [place] [address]  Culture, blood (x 2)     Status: None (Preliminary result)   Collection Time: 05/27/23  8:18 PM   Specimen: BLOOD  Result Value Ref Range   Specimen Description BLOOD BLOOD LEFT HAND    Special Requests      BOTTLES DRAWN AEROBIC ONLY Blood Culture adequate volume   Culture      NO GROWTH < 12 HOURS Performed at [facility], [address] [place], [place] [address]    Report Status PENDING   Surgical PCR screen     Status: None   Collection Time: 05/27/23 10:49 PM   Specimen: Nasal Mucosa; Nasal Swab  Result  Value Ref Range   MRSA, PCR NEGATIVE NEGATIVE   Staphylococcus aureus NEGATIVE NEGATIVE    Comment: (NOTE) The Xpert SA Assay (FDA approved for NASAL specimens in patients 19 years of age and older), is one component of a comprehensive surveillance program. It is not intended to diagnose infection nor to guide or monitor treatment. Performed at [facility], [address] [place], [place] [address]   Protime-INR     Status: None   Collection Time: 05/28/23  4:05 AM  Result Value Ref Range   Prothrombin Time 13.9 11.4 - 15.2 seconds   INR 1.1 0.8 - 1.2    Comment: (NOTE) INR goal varies based on device and disease states. Performed at [facility], [address] [place], [place] [address]   Cortisol-am, blood     Status: Abnormal   Collection Time: 05/28/23  4:05 AM  Result Value Ref Range   Cortisol - AM 3.6 (L) 6.7 - 22.6 ug/dL    Comment: Performed at [facility], [address], [place], [place] [address]  Procalcitonin     Status: None   Collection Time: 05/28/23  4:05 AM  Result Value Ref Range   Procalcitonin <0.10 ng/mL    Comment:        Interpretation:  PCT (Procalcitonin) <= 0.5 ng/mL: Systemic infection (sepsis) is not likely. Local bacterial infection is possible. (NOTE)       Sepsis PCT Algorithm           Lower Respiratory Tract                                      Infection PCT Algorithm    ----------------------------     ----------------------------         PCT < 0.25 ng/mL                PCT < 0.10 ng/mL          Strongly encourage             Strongly discourage   discontinuation of antibiotics    initiation of antibiotics    ----------------------------     -----------------------------       PCT 0.25 - 0.50 ng/mL            PCT 0.10 - 0.25 ng/mL               OR       >80% decrease in PCT            Discourage initiation of                                            antibiotics      Encourage discontinuation           of antibiotics     ----------------------------     -----------------------------         PCT >= 0.50 ng/mL              PCT 0.26 - 0.50 ng/mL               AND        <80% decrease in PCT             Encourage initiation of                                             antibiotics       Encourage continuation           of antibiotics    ----------------------------     -----------------------------        PCT >= 0.50 ng/mL                  PCT > 0.50 ng/mL               AND         increase in PCT                  Strongly encourage                                      initiation of antibiotics    Strongly encourage escalation           of antibiotics                                     -----------------------------  PCT <= 0.25 ng/mL                                                 OR                                        > 80% decrease in PCT                                      Discontinue / Do not initiate                                             antibiotics  Performed at [facility], [address], [place], [place] [address]   CBC     Status: Abnormal   Collection Time: 05/28/23  4:05 AM  Result Value Ref Range   WBC 14.1 (H) 4.0 - 10.5 K/uL   RBC 4.81 4.22 - 5.81 MIL/uL   Hemoglobin 13.5 13.0 - 17.0 g/dL   HCT 16.0 10.9 - 32.3 %   MCV 84.8 80.0 - 100.0 fL   MCH 28.1 26.0 - 34.0 pg   MCHC 33.1 30.0 - 36.0 g/dL   RDW 55.7 32.2 - 02.5 %   Platelets 317 150 - 400 K/uL   nRBC 0.0 0.0 - 0.2 %    Comment: Performed at [facility], [address] [place], [place] [address]    MR PELVIS W WO CONTRAST  Result Date: 05/27/2023 CLINICAL DATA:  Inflammation of the right buttock region. Patient has a history of perianal fistula and perianal abscess. EXAM: MRI PELVIS WITHOUT AND WITH CONTRAST TECHNIQUE: Multiplanar multisequence MR imaging of the pelvis was performed both before and after administration of intravenous contrast. CONTRAST:  10mL GADAVIST  GADOBUTROL 1 MMOL/ML IV SOLN COMPARISON:  Prior pelvic CT scan from 2019 is not available for direct comparison. FINDINGS: Urinary Tract: The bladder is unremarkable. No bladder mass or calculi. Bowel: The rectum, visualized sigmoid colon and pelvic small bowel loops are grossly normal. Vascular/Lymphatic: No vascular abnormalities. Small bilateral inguinal lymph nodes. No pelvic adenopathy. Reproductive:  The prostate gland and seminal vesicles are. 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 There is a focal rim enhancing 2.9 x 2.4 cm abscess in the midline. Significant surrounding changes of cellulitis involving the skin and subcutaneous fat. Musculoskeletal: No significant bony findings. IMPRESSION: 1. 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 2. Focal rim enhancing 2.9 x 2.4 cm abscess in the midline. Significant surrounding changes of cellulitis involving the skin and subcutaneous fat. 3. No significant intrapelvic findings. Electronically Signed   By: Rudie Meyer M.D.   On: 05/27/2023 18:38     Assessment & Plan:  Tim Tucker is a 32 y.o. 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  Imaging and blood work evaluated by myself.  -I discussed with the patient that it looks like he has a recurrent perirectal abscess.  The imaging findings are also concerning for a transsphincteric perianal fistula, which means there is a communication from his anus to his skin that is traversing his anal sphincter muscle.  I discussed that given his significant pain and erythema, I would like to proceed to the operating room where I can do a complete  physical exam and drain the associated abscess.  We also discussed that he potentially would need evaluation by colorectal surgery in the future given this concern for an anal fistula involving his sphincter muscle -The risk and benefits of incision and drainage of perirectal abscess, anal exam under anesthesia were discussed including but not limited to bleeding, infection, injury to surrounding structures, fecal incontinence, and need for additional procedures.  After careful consideration, Tonya Kopas has decided to proceed with surgery. -We further discussed that this could be a presentation of Crohn's disease, and he will likely need a colonoscopy in the near future -NPO -IV Zosyn -PRN pain control and antiemetics -Further recommendations to follow surgery  All questions were answered to the satisfaction of the patient. -- Theophilus Kinds, DO [facility] [address] [place], [place] [address] [phone] (office)

## 2023-05-28 NOTE — Op Note (Addendum)
[facility] Operative Note  05/28/23  Preoperative Diagnosis: Perirectal abscess, concern for anal fistula   Postoperative Diagnosis: Perirectal abscess   Procedure(s) Performed: Incision and drainage of perirectal abscess with drain placement , anal exam under anesthesia   Surgeon: Theophilus Kinds, DO    Assistants: No qualified resident was available    Anesthesia: General endotracheal   Anesthesiologist: Ronelle Nigh, MD    Specimens: Culture of perirectal abscess   Estimated Blood Loss: 15 cc   Blood Replacement: None    Complications: None   Wound Class: Dirty/Infected   Operative Indications: Patient is a 32 year old who presented to the hospital with rectal bleeding and concern for recurrent perirectal abscess.  In the ED, he underwent a CT of the abdomen and pelvis which demonstrated concerning findings for transsphincteric perianal fistula and an associated abscess cavity.  Decision was made to take the patient to the operating room for complete examination and drainage of this abscess.  Patient is agreeable to surgery.  All risks and benefits of performing this procedure were discussed with the patient including pain, infection, bleeding, damage to the surrounding structures, fecal incontinence, and need for more procedures or surgery. The patient voiced understanding of the procedure, all questions were sought and answered, and consent was obtained.  Findings: Perirectal abscess at the 12 o'clock position with bloody, purulent drainage, opening at the 12 o'clock position at the anal verge, area tract anteriorly along the perineum towards the scrotum, no evidence of communication more proximally within the anus   Procedure: The patient was taken to the operating room and placed supine. General endotracheal anesthesia was induced.  Patient was receiving IV antibiotics on the floor.  Patient was then placed into a lithotomy position.  The anus,  buttocks, and perineum were prepared and draped in the usual sterile fashion.  A time-out was completed verifying correct patient, procedure, site, positioning, and implant(s) and/or special equipment prior to beginning this procedure.  On examination, the patient had significant induration, erythema, and edema of the right gluteal region without any evidence of fluctuance, consistent with cellulitis.  Digital rectal exam was performed, and there was mild fluctuance noted in the 12 o'clock position with a punctate opening with bloody, purulent drainage at the anal verge.  Anoscope was inserted, and hydrogen peroxide was instilled within this area.  There was no evidence of communication more proximally within the anus.  The punctate opening was then further opened away from the anus using scalpel.  More bloody, purulent drainage was noted, and wound culture was obtained.  This cavity was then probed, and there was a tract headed towards the base of the scrotum along the perineum.  A counterincision was then made in the peritoneum overlying this tract.  The cavity was probed and hard endpoints were noted within the cavity.  The cavity did not communicate with the right gluteal region.  The cavity was then copiously irrigated with saline until fluid returned clear.  Hemostasis was noted.  1/4 inch Penrose drain was then inserted through the openings and sutured to itself to create a ring.  Quarter inch iodoform was then packed at the 2 incision sites.  The area around the incision sites was instilled with Marcaine.  A rolled piece of Gelfoam was inserted into the anus, and the area was covered with viscous lidocaine.  A small gauze roll was then also inserted in the gluteal cleft and mesh panties were placed.  Final inspection revealed acceptable hemostasis. All  counts were correct at the end of the case. The patient was awakened from anesthesia and extubated without complication.  The patient went to the PACU in  stable condition.   Theophilus Kinds, DO  [facility] [address] [place], [place] [address] [phone] (office)

## 2023-05-28 NOTE — Discharge Instructions (Signed)
Seton Placement/ Anal Fistula Discharge Instructions:   What is the purpose of a seton placement? Setons are used to treat anal fistulas. A fistula is an abnormal tunnel that connects two structures. An anal fistula is an abnormal tunnel that forms between the anus and the surrounding skin. The fistulas internal opening is in the anus and the external opening is typically on the skin around the anus or on the buttocks.  Activity:  Many individuals are able to return to work and resume routine activities the day after their procedure. Some people require a week off from work if the surgery is more extensive. Generally, within 1-2 weeks, surgical discomfort is Minimal.  Care Instructions: You can resume your normal diet once you have sufficiently recovered from anesthesia. You should drink lots of liquid. Water is best. Try to drink at least 6-8 glasses of liquid daily.  Medications: It is very important to prevent constipation after surgery. You should take a stool softener, such as docusate sodium (Colace), twice a day for the first two weeks. Also take a fiber supplement such as Benefiber, Citrucel, or Metamucil, twice a day every day. Consult your pharmacist if you need help. If your stools become loose, you can stop taking the softeners.  Take tylenol and ibuprofen as needed for pain control, alternating every 4-6 hours.  Example:  Tylenol 1000mg  @ 6am, 12noon, 6pm, (Do not exceed 4000mg  of tylenol a day).  Ibuprofen 800mg  @ 9am, 3pm, 9pm, 3am (Do not exceed 3600mg  of ibuprofen a day).  Take Roxicodone for breakthrough pain every 4 hours.  Drink plenty of water to also prevent constipation.  Do not drive, operate machinery, or drink alcohol when taking narcotic pain medication.  Within a few days, your pain should be sufficiently controlled with medications such as ibuprofen or acetaminophen.  Dressing: You can replace your pad as needed after surgery. Feminine  pads work best.  It is normal to see some bloody drainage on the dressing.  Bleeding should not be heavy or continuous.   Hygiene: Keep the surgical area clean by taking Sitz baths (or tub baths) several times per day. These are helpful for cleaning after each bowel movement. Frequent showering is an alternative to baths, although many patients find baths soothing after surgery.  Equipment for [employer] can be purchased at [employer] or medical/surgical supply stores. o Use warm (not hot) water for cleansing. o Pat the area dry or use a hairdryer to evaporate any residual moisture. o If you use a hairdryer, use the cool or warm setting, to avoid potential heat injury to the skin.  Sitting on a pillow or ice pack may also provide relief. Do not apply ice directly to the skin, use a towel or pillow case as a buffer. We do not encourage sitting on an inflatable ring (doughnut) as this leads to unopposed downward pressure in the anal area.  You should gently rotate the Kep'el, every other day or so, to prevent it from getting crusted with bodily fluids.  Occasionally, the Burnadette Pop may become displaced and fall out. If this occurs, it is not an emergency. Please call our office the next business day so that we may evaluate.  What symptoms should I expect?  It is normal to have pain for up to 1-2 weeks. Thereafter, you may notice discomfort with prolonged sitting and certain activities. Pain should not be constant or worsening.   Placement of Setons may stimulate mucus production so the volume of drainage  you are having may increase at first. The volume of drainage should lessen as healing occurs.

## 2023-05-28 NOTE — Anesthesia Procedure Notes (Addendum)
Procedure Name: Intubation Date/Time: 05/28/2023 11:06 AM  Performed by: Oletha Cruel, CRNAPre-anesthesia Checklist: Patient identified, Emergency Drugs available, Suction available, Patient being monitored and Timeout performed Patient Re-evaluated:Patient Re-evaluated prior to induction Oxygen Delivery Method: Circle system utilized Preoxygenation: Pre-oxygenation with 100% oxygen Induction Type: IV induction Ventilation: Mask ventilation without difficulty Laryngoscope Size: Mac and 4 Grade View: Grade III Tube type: Oral Tube size: 7.0 mm Number of attempts: 1 Airway Equipment and Method: Stylet Placement Confirmation: ETT inserted through vocal cords under direct vision, positive ETCO2, CO2 detector and breath sounds checked- equal and bilateral Secured at: 22 cm Tube secured with: Tape Dental Injury: Teeth and Oropharynx as per pre-operative assessment  Comments: Atraumatic insertion of oral endotracheal tube.

## 2023-05-28 NOTE — Progress Notes (Signed)
Transition of Care Department [facility]) has reviewed patient and no other TOC needs have been identified at this time. We will continue to monitor patient advancement through interdisciplinary progression rounds. If new patient transition needs arise, please place a TOC consult.   05/28/23 0746  TOC Brief Assessment  Insurance and Status Lapsed (Pt reports income is too high for Medicaid. Pt is looking into private insurance.)  Patient has primary care physician Yes  Home environment has been reviewed Lives with independent.  Prior level of function: Independent.  Prior/Current Home Services No current home services  Social Determinants of Health Reivew SDOH reviewed no interventions necessary  Readmission risk has been reviewed Yes  Transition of care needs no transition of care needs at this time

## 2023-05-28 NOTE — Plan of Care (Signed)
Problem: Education: Goal: Knowledge of General Education information will improve Description Including pain rating scale, medication(s)/side effects and non-pharmacologic comfort measures Outcome: Progressing   Problem: Health Behavior/Discharge Planning: Goal: Ability to manage health-related needs will improve Outcome: Progressing   Problem: Clinical Measurements: Goal: Will remain free from infection Outcome: Progressing Goal: Diagnostic test results will improve Outcome: Progressing Goal: Cardiovascular complication will be avoided Outcome: Progressing   Problem: Activity: Goal: Risk for activity intolerance will decrease Outcome: Progressing

## 2023-05-28 NOTE — Hospital Course (Signed)
32 y.o. male with medical history significant for essential hypertension and hidradenitis suppurativa with previous history of gluteal abscesses and anal fistula, presented to the emergency room with acute onset of right gluteal worsening swelling over the last few days that started on Thursday with associated pain with occasional bleeding while he was wiping with bowel movements.  No fever or chills.  No nausea or vomiting or abdominal pain.  No chest pain or palpitations.  No cough or wheezing or dyspnea.  He was seen at [facility] ED for similar symptoms but was provided with hydrocortisone suppositories.  He went home and has been doing sitz bath but his pain has been worsening.  His last bowel movement was today at 12 noon and it was normal.  No bright red bleeding per rectum or melena.  He took ibuprofen at noon today without relief.   ED Course: Upon presentation to the emergency room, BP was 148/101 with otherwise normal vital signs.  Labs revealed unremarkable CMP.  Lactic acid 0.8 and CBC showed leukocytosis 16.7 with neutrophilia.  Coag profile was within normal.  Blood cultures were drawn.   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 Focal rim enhancing 2.9 x 2.4 cm abscess in the midline. Significant surrounding changes of cellulitis involving the skin and subcutaneous fat.   The patient was ordered IV Zosyn.  General surgery consult was obtained and recommendation was for IV antibiotics for now pending I&D.  Patient underwent I & D on 05/28/23 with placement of penrose drain.  Packing was removed on 05/29/23.

## 2023-05-28 NOTE — Progress Notes (Addendum)
[facility]  Spoke with the patient's wife on the phone.  I explained that he tolerated the procedure without difficulty.  I explained that he has a perirectal fistula that when I probed the area tracked along his perineum and towards the scrotum.  I did not find any other external openings.  To be able to place a drain and allow this area to adequately drain, I did create a counterincision and placed a Penrose drain.  We discussed that he also has packing in the cavity around the Penrose drain.  He also has a Gelfoam roll in his anus that will come out with his first bowel movement.  Patient's wife expressed that she is [redacted] weeks pregnant, and is unsure when she will be going into labor.  I discussed that ideally, we would like him to stay for at least an additional day of IV antibiotics to verify that he is improving prior to discharge.  However, I did express that if she goes into labor today, given he is young and healthy, it would be reasonable that we could discharge him with oral antibiotics with him to have short-term follow-up with me.  I will update the hospitalist regarding this.  All questions were answered to her expressed satisfaction.  Plan: -Return to room on the floor -Okay for regular diet -PRN pain control and antiemetics -Await final abscess culture -Continue IV Zosyn -Plan for repeat blood work in the morning -Will plan to remove packing around [employer].  If patient leaves tonight because his wife goes into labor, this packing will need to be removed in the next 24 hours -Patient also has a gelfoam roll in his anus.  This will come out with his first bowel movement -Bowel regimen ordered -Appreciate hospitalist recommendations -Discussed with hospitalist that if patient's wife goes into labor, he is ok to leave from a surgical standpoint with prescription for oral Bactrim for 10 days.  He will also have short term follow up with me if this is the  case  Theophilus Kinds, DO [facility] [address] [place], [place] [address] [phone] (office)

## 2023-05-29 ENCOUNTER — Other Ambulatory Visit (HOSPITAL_COMMUNITY): Payer: Self-pay

## 2023-05-29 ENCOUNTER — Encounter (HOSPITAL_COMMUNITY): Payer: Self-pay | Admitting: Surgery

## 2023-05-29 DIAGNOSIS — K611 Rectal abscess: Secondary | ICD-10-CM | POA: Diagnosis not present

## 2023-05-29 DIAGNOSIS — A419 Sepsis, unspecified organism: Secondary | ICD-10-CM | POA: Diagnosis not present

## 2023-05-29 DIAGNOSIS — E66812 Obesity, class 2: Secondary | ICD-10-CM

## 2023-05-29 LAB — CBC
HCT: 42.3 % (ref 39.0–52.0)
Hemoglobin: 13.8 g/dL (ref 13.0–17.0)
MCH: 27.3 pg (ref 26.0–34.0)
MCHC: 32.6 g/dL (ref 30.0–36.0)
MCV: 83.6 fL (ref 80.0–100.0)
Platelets: 358 10*3/uL (ref 150–400)
RBC: 5.06 MIL/uL (ref 4.22–5.81)
RDW: 11.9 % (ref 11.5–15.5)
WBC: 16.9 10*3/uL — ABNORMAL HIGH (ref 4.0–10.5)
nRBC: 0 % (ref 0.0–0.2)

## 2023-05-29 NOTE — Plan of Care (Signed)

## 2023-05-29 NOTE — Progress Notes (Addendum)
[facility] Progress Note  1 Day Post-Op  Subjective: Patient seen and examined.  He is resting comfortably in bed.  He still has pain near his surgical site, but states the pain is more when he is moving now.  He is tolerating a diet without nausea and vomiting.  He confirms passing flatus but denies any bowel movement since surgery.  He denies any significant drainage from the incision site.  Objective: Vital signs in last 24 hours: Temp:  [97.6 F (36.4 C)-98.6 F (37 C)] 98.1 F (36.7 C) (11/27 0751) Pulse Rate:  [72-93] 72 (11/27 0751) Resp:  [12-20] 19 (11/27 0751) BP: (107-133)/(63-78) 118/63 (11/27 0751) SpO2:  [90 %-98 %] 97 % (11/27 0751) FiO2 (%):  [0 %] 0 % (11/26 1203) Last BM Date : 05/27/23  Intake/Output from previous day: 11/26 0701 - 11/27 0700 In: 1245.9 [P.O.:150; I.V.:900; IV Piggyback:195.9] Out: 1700 [Urine:1700] Intake/Output this shift: Total I/O In: 240 [P.O.:240] Out: -   General appearance: alert, cooperative, and no distress Anorectal: Improving erythema and induration of the right buttock, still significant tenderness around anus and at Penrose drain, iodoform packing in place, no significant purulent drainage  Lab Results:  Recent Labs    05/28/23 0405 05/29/23 0408  WBC 14.1* 16.9*  HGB 13.5 13.8  HCT 40.8 42.3  PLT 317 358   BMET Recent Labs    05/27/23 1653  NA 135  K 3.7  CL 99  CO2 26  GLUCOSE 102*  BUN 21*  CREATININE 0.83  CALCIUM 8.9   PT/INR Recent Labs    05/27/23 2016 05/28/23 0405  LABPROT 13.1 13.9  INR 1.0 1.1    Studies/Results: MR PELVIS W WO CONTRAST  Result Date: 05/27/2023 CLINICAL DATA:  Inflammation of the right buttock region. Patient has a history of perianal fistula and perianal abscess. EXAM: MRI PELVIS WITHOUT AND WITH CONTRAST TECHNIQUE: Multiplanar multisequence MR imaging of the pelvis was performed both before and after administration of intravenous contrast. CONTRAST:   10mL GADAVIST GADOBUTROL 1 MMOL/ML IV SOLN COMPARISON:  Prior pelvic CT scan from 2019 is not available for direct comparison. FINDINGS: Urinary Tract: The bladder is unremarkable. No bladder mass or calculi. Bowel: The rectum, visualized sigmoid colon and pelvic small bowel loops are grossly normal. Vascular/Lymphatic: No vascular abnormalities. Small bilateral inguinal lymph nodes. No pelvic adenopathy. Reproductive:  The prostate gland and seminal vesicles are. 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 There is a focal rim enhancing 2.9 x 2.4 cm abscess in the midline. Significant surrounding changes of cellulitis involving the skin and subcutaneous fat. Musculoskeletal: No significant bony findings. IMPRESSION: 1. 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 2. Focal rim enhancing 2.9 x 2.4 cm abscess in the midline. Significant surrounding changes of cellulitis involving the skin and subcutaneous fat. 3. No significant intrapelvic findings. Electronically Signed   By: Rudie Meyer M.D.   On: 05/27/2023 18:38    Anti-infectives: Anti-infectives (From admission, onward)    Start     Dose/Rate Route Frequency Ordered Stop   05/27/23 2000  piperacillin-tazobactam (ZOSYN) IVPB 3.375 g        3.375 g 12.5 mL/hr over 240 Minutes Intravenous Every 8 hours 05/27/23 1948  Assessment/Plan:  Patient is a 32 year old male who was admitted with a perirectal abscess and concern for an anal fistula.  He is status post incision and drainage of perirectal abscess with drain placement on 11/26.  -Patient's leukocytosis increased today to 16.9 from 14.1.  This is likely related to having  undergone surgery yesterday -Continue IV Zosyn -Gram stain with gram-positive cocci in pairs and gram-negative rods, await final culture.  Blood cultures with no growth to date -Regular diet -PRN pain control and antiemetics -Continue bowel regimen. -Packing removed from surgical site.  Will try to keep the area clean and dry.  If able in the hospital, would recommend sitz bath's 3-4 times a day -Plan to keep patient another day, as he still has a significant amount of pain at his incision site with an increasing leukocytosis.  We will give him a little bit more to have a bowel movement today, to verify his pain is controllable after a bowel movement -Anticipate discharge in the next 24 to 48 hours -Patient's wife is [redacted] weeks pregnant and is scheduled to be induced on Sunday.  If she goes into labor prior to this, patient is stable for discharge and would recommend discharge home with Bactrim for a total of 10 days of treatment   LOS: 2 days    Kaeden Depaz A Yzabella Crunk 05/29/2023

## 2023-05-29 NOTE — Progress Notes (Signed)
Showered.

## 2023-05-29 NOTE — Progress Notes (Signed)
Had large, soft, brown bm in toilet and passed gelfoam roll.  Still wants to stay the night.  Contacted Dr. Robyne Peers with update.

## 2023-05-29 NOTE — Progress Notes (Deleted)
Ambulated to toilet and had large, soft, brown bm and passed the gelfoam roll.  Contacted Dr.  Robyne Peers with update

## 2023-05-29 NOTE — Progress Notes (Signed)
PROGRESS NOTE  Tim Tucker WUX:[id number] DOB: 1990/07/14 DOA: 05/27/2023 PCP: Toma Deiters, MD  Brief History:  32 y.o. male with medical history significant for essential hypertension and hidradenitis suppurativa with previous history of gluteal abscesses and anal fistula, presented to the emergency room with acute onset of right gluteal worsening swelling over the last few days that started on Thursday with associated pain with occasional bleeding while he was wiping with bowel movements.  No fever or chills.  No nausea or vomiting or abdominal pain.  No chest pain or palpitations.  No cough or wheezing or dyspnea.  He was seen at [facility] ED for similar symptoms but was provided with hydrocortisone suppositories.  He went home and has been doing sitz bath but his pain has been worsening.  His last bowel movement was today at 12 noon and it was normal.  No bright red bleeding per rectum or melena.  He took ibuprofen at noon today without relief.   ED Course: Upon presentation to the emergency room, BP was 148/101 with otherwise normal vital signs.  Labs revealed unremarkable CMP.  Lactic acid 0.8 and CBC showed leukocytosis 16.7 with neutrophilia.  Coag profile was within normal.  Blood cultures were drawn.   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 Focal rim enhancing 2.9 x 2.4 cm abscess in the midline. Significant surrounding changes of cellulitis involving the skin and subcutaneous fat.   The patient was ordered IV Zosyn.  General surgery consult was obtained and recommendation was for IV antibiotics for now pending I&D.  Patient underwent I & D on 05/28/23 with placement of penrose drain.  Packing was removed on 05/29/23.   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  Denies f/c, cp, sob, n/v/d  Objective: Vitals:   05/29/23 0046 05/29/23 0326 05/29/23 0751 05/29/23 1526  BP: 124/71 126/76 118/63 127/72  Pulse: 76 77 72 86  Resp: 18 20 19 18   Temp: 98 F (36.7 C) 97.9 F (36.6 C) 98.1 F (36.7 C) 97.8 F (36.6 C)  TempSrc:  Oral Oral Oral  SpO2: 95% 96% 97% 98%  Weight:      Height:        Intake/Output Summary (Last 24 hours) at 05/29/2023 1613 Last data filed at 05/29/2023 1011 Gross per 24 hour  Intake 379.72 ml  Output 1700 ml  Net -1320.28 ml   Weight change: 1.751 kg Exam:  General:  Pt is alert, follows commands appropriately, not in acute distress HEENT: No icterus, No thrush, No neck mass, [place]/AT Cardiovascular: RRR, S1/S2, no rubs, no gallops Respiratory:  CTA bilaterally, no wheezing, no crackles, no rhonchi Abdomen: Soft/+BS, non tender, non distended, no guarding Extremities: No edema, No lymphangitis, No petechiae, No rashes, no synovitis -peri-rectal area with edema and induration with penrose drain in place   Data Reviewed: I have personally reviewed following labs and imaging studies Basic Metabolic Panel: Recent Labs  Lab 05/27/23 1653  NA 135  K 3.7  CL 99  CO2 26  GLUCOSE 102*  BUN 21*  CREATININE 0.83  CALCIUM 8.9    Liver Function Tests: Recent Labs  Lab 05/27/23 1653  AST 15  ALT 24  ALKPHOS 82  BILITOT 0.7  PROT 7.5  ALBUMIN 3.8   No results for input(s): "LIPASE", "AMYLASE" in the last 168 hours. No results for input(s): "AMMONIA" in the last 168 hours. Coagulation Profile: Recent Labs  Lab 05/27/23 2016 05/28/23 0405  INR 1.0 1.1   CBC: Recent Labs  Lab 05/27/23 1653 05/28/23 0405 05/29/23 0408  WBC 16.7* 14.1* 16.9*  NEUTROABS 12.7*  --   --   HGB 15.3 13.5 13.8  HCT 45.8 40.8 42.3  MCV 83.4 84.8 83.6  PLT 365 317 358   Cardiac Enzymes: No results for input(s): "CKTOTAL", "CKMB", "CKMBINDEX", "TROPONINI" in the last 168 hours. BNP: Invalid input(s): "POCBNP" CBG: No results for input(s): "GLUCAP" in the last 168 hours. HbA1C: No results for input(s): "HGBA1C" in the last 72 hours. Urine analysis:    Component Value Date/Time   COLORURINE YELLOW 10/03/2011 2238   APPEARANCEUR CLEAR 10/03/2011 2238   LABSPEC <1.005 (L) 10/03/2011 2238   PHURINE 5.5 10/03/2011 2238   GLUCOSEU NEGATIVE 10/03/2011 2238   HGBUR NEGATIVE 10/03/2011 2238   BILIRUBINUR NEGATIVE 10/03/2011 2238   KETONESUR NEGATIVE 10/03/2011 2238   PROTEINUR NEGATIVE 10/03/2011 2238   UROBILINOGEN 0.2 10/03/2011 2238   NITRITE NEGATIVE 10/03/2011 2238   LEUKOCYTESUR NEGATIVE 10/03/2011 2238   Sepsis Labs: @LABRCNTIP (procalcitonin:4,lacticidven:4) ) Recent Results (from the past 240 hour(s))  Culture, blood (x 2)     Status: None (Preliminary result)   Collection Time: 05/27/23  8:16 PM   Specimen: BLOOD  Result Value Ref Range Status   Specimen Description BLOOD BLOOD LEFT ARM  Final   Special Requests   Final    BOTTLES DRAWN AEROBIC AND ANAEROBIC Blood Culture adequate volume   Culture   Final    NO GROWTH < 12 HOURS Performed at [facility], [address], [place], [place] [address]    Report Status PENDING  Incomplete  Culture, blood (x 2)     Status: None (Preliminary result)    Collection Time: 05/27/23  8:18 PM   Specimen: BLOOD  Result Value Ref Range Status   Specimen Description BLOOD BLOOD LEFT HAND  Final   Special Requests   Final    BOTTLES DRAWN AEROBIC ONLY Blood Culture adequate volume   Culture   Final    NO GROWTH < 12 HOURS Performed at [facility], [address], [place], [place] [address]    Report Status PENDING  Incomplete  Surgical PCR screen     Status: None   Collection Time: 05/27/23 10:49 PM   Specimen: Nasal Mucosa; Nasal Swab  Result Value Ref Range Status   MRSA, PCR NEGATIVE NEGATIVE Final   Staphylococcus aureus NEGATIVE NEGATIVE Final    Comment: (NOTE) The Xpert SA Assay (FDA approved for NASAL specimens in patients 22 years of age and older), is one component of a comprehensive surveillance program. It is not intended to  diagnose infection nor to guide or monitor treatment. Performed at [facility], [address], [place], [place] [address]   Aerobic/Anaerobic Culture w Gram Stain (surgical/deep wound)     Status: None (Preliminary result)   Collection Time: 05/28/23 11:29 AM   Specimen: Path fluid; Body Fluid  Result Value Ref Range Status   Specimen Description   Final    ABSCESS PERIRECTAL Performed at [facility], [address], [place], [place] [address]    Special Requests   Final    NONE Performed at [facility], [address], [place], [place] [address]    Gram Stain   Final    ABUNDANT WBC PRESENT, PREDOMINANTLY PMN ABUNDANT GRAM NEGATIVE RODS ABUNDANT GRAM POSITIVE COCCI IN PAIRS    Culture   Final    CULTURE REINCUBATED FOR BETTER GROWTH Performed at [facility], [address] [place], [place] [address]    Report Status PENDING  Incomplete     Scheduled Meds:  acetaminophen  1,000 mg Oral Q6H   acidophilus  1 capsule Oral Daily   enoxaparin (LOVENOX) injection  50 mg Subcutaneous Q24H   famotidine  20 mg Oral Daily   polyethylene glycol  17 g Oral Daily   senna-docusate  1  tablet Oral BID   Continuous Infusions:  piperacillin-tazobactam (ZOSYN)  IV 3.375 g (05/29/23 1444)    Procedures/Studies: MR PELVIS W WO CONTRAST  Result Date: 05/27/2023 CLINICAL DATA:  Inflammation of the right buttock region. Patient has a history of perianal fistula and perianal abscess. EXAM: MRI PELVIS WITHOUT AND WITH CONTRAST TECHNIQUE: Multiplanar multisequence MR imaging of the pelvis was performed both before and after administration of intravenous contrast. CONTRAST:  10mL GADAVIST GADOBUTROL 1 MMOL/ML IV SOLN COMPARISON:  Prior pelvic CT scan from 2019 is not available for direct comparison. FINDINGS: Urinary Tract: The bladder is unremarkable. No bladder mass or calculi. Bowel: The rectum, visualized sigmoid colon and pelvic small bowel loops are grossly normal. Vascular/Lymphatic: No vascular abnormalities. Small bilateral inguinal lymph nodes. No pelvic adenopathy. Reproductive:  The prostate gland and seminal vesicles are. 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 There is a focal rim enhancing 2.9 x 2.4 cm abscess in the midline. Significant surrounding changes of cellulitis involving the skin and subcutaneous fat. Musculoskeletal: No significant bony findings. IMPRESSION: 1. 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 2. Focal rim enhancing 2.9 x 2.4 cm abscess in the midline. Significant surrounding changes of cellulitis involving the skin and subcutaneous fat. 3. No significant intrapelvic findings. Electronically Signed   By: Rudie Meyer M.D.   On: 05/27/2023 18:38    Catarina Hartshorn, DO  [facility]  If 7PM-7AM, please contact  night-coverage [email or web address] Password [email or web address] 05/29/2023, 4:13 PM   LOS: 2 days

## 2023-05-29 NOTE — TOC Benefit Eligibility Note (Signed)
Pharmacy Patient Advocate Encounter  Insurance verification completed.    The patient is insured through  [facility]    Ran test claim for Linezolid. Currently a quantity of 28 is a 14 day supply and the co-pay is $30.00 .   This test claim was processed through [facility]- copay amounts may vary at other pharmacies due to pharmacy/plan contracts, or as the patient moves through the different stages of their insurance plan.

## 2023-05-30 DIAGNOSIS — L0390 Cellulitis, unspecified: Secondary | ICD-10-CM | POA: Diagnosis not present

## 2023-05-30 DIAGNOSIS — E66812 Obesity, class 2: Secondary | ICD-10-CM | POA: Diagnosis not present

## 2023-05-30 DIAGNOSIS — K611 Rectal abscess: Secondary | ICD-10-CM | POA: Diagnosis not present

## 2023-05-30 DIAGNOSIS — A419 Sepsis, unspecified organism: Secondary | ICD-10-CM | POA: Diagnosis not present

## 2023-05-30 LAB — CBC
HCT: 39.9 % (ref 39.0–52.0)
Hemoglobin: 13.4 g/dL (ref 13.0–17.0)
MCH: 28.2 pg (ref 26.0–34.0)
MCHC: 33.6 g/dL (ref 30.0–36.0)
MCV: 84 fL (ref 80.0–100.0)
Platelets: 368 10*3/uL (ref 150–400)
RBC: 4.75 MIL/uL (ref 4.22–5.81)
RDW: 12.3 % (ref 11.5–15.5)
WBC: 13 10*3/uL — ABNORMAL HIGH (ref 4.0–10.5)
nRBC: 0 % (ref 0.0–0.2)

## 2023-05-30 LAB — BASIC METABOLIC PANEL
Anion gap: 9 (ref 5–15)
BUN: 17 mg/dL (ref 6–20)
CO2: 25 mmol/L (ref 22–32)
Calcium: 8.6 mg/dL — ABNORMAL LOW (ref 8.9–10.3)
Chloride: 103 mmol/L (ref 98–111)
Creatinine, Ser: 0.87 mg/dL (ref 0.61–1.24)
GFR, Estimated: >60 mL/min (ref >60)
Glucose, Bld: 104 mg/dL — ABNORMAL HIGH (ref 70–99)
Potassium: 3.7 mmol/L (ref 3.5–5.1)
Sodium: 137 mmol/L (ref 135–145)

## 2023-05-30 LAB — MAGNESIUM: Magnesium: 2.3 mg/dL (ref 1.7–2.4)

## 2023-05-30 MED ORDER — ACETAMINOPHEN 500 MG PO TABS: 1000.0000 mg | ORAL_TABLET | Freq: Four times a day (QID) | ORAL | 0 refills | Status: AC

## 2023-05-30 MED ORDER — SULFAMETHOXAZOLE-TRIMETHOPRIM 800-160 MG PO TABS: 1.0000 | ORAL_TABLET | Freq: Two times a day (BID) | ORAL | 0 refills | Status: AC

## 2023-05-30 MED ORDER — OXYCODONE HCL 5 MG PO TABS: 5.0000 mg | ORAL_TABLET | Freq: Four times a day (QID) | ORAL | 0 refills | Status: DC | PRN

## 2023-05-30 MED ORDER — AMOXICILLIN-POT CLAVULANATE 875-125 MG PO TABS: 1.0000 | ORAL_TABLET | Freq: Two times a day (BID) | ORAL | Status: DC

## 2023-05-30 MED ORDER — AMOXICILLIN-POT CLAVULANATE 875-125 MG PO TABS: 1.0000 | ORAL_TABLET | Freq: Two times a day (BID) | ORAL | 0 refills | Status: DC

## 2023-05-30 MED ORDER — ONDANSETRON HCL 4 MG PO TABS: 4.0000 mg | ORAL_TABLET | Freq: Every day | ORAL | 1 refills | Status: DC | PRN

## 2023-05-30 MED ORDER — SULFAMETHOXAZOLE-TRIMETHOPRIM 800-160 MG PO TABS: 1.0000 | ORAL_TABLET | Freq: Two times a day (BID) | ORAL | 0 refills | Status: DC

## 2023-05-30 NOTE — Discharge Summary (Signed)
Physician Discharge Summary   Patient: Tim Tucker MRN: [id number] DOB: 11/24/1990  Admit date:     05/27/2023  Discharge date: 05/30/23  Discharge Physician: Onalee Hua Ajeenah Heiny   PCP: Toma Deiters, MD   Recommendations at discharge:   Please follow up with primary care provider within 1-2 weeks  Please repeat BMP and CBC in one week    Hospital Course: 32 y.o. male with medical history significant for essential hypertension and hidradenitis suppurativa with previous history of gluteal abscesses and anal fistula, presented to the emergency room with acute onset of right gluteal worsening swelling over the last few days that started on Thursday with associated pain with occasional bleeding while he was wiping with bowel movements.  No fever or chills.  No nausea or vomiting or abdominal pain.  No chest pain or palpitations.  No cough or wheezing or dyspnea.  He was seen at [facility] ED for similar symptoms but was provided with hydrocortisone suppositories.  He went home and has been doing sitz bath but his pain has been worsening.  His last bowel movement was today at 12 noon and it was normal.  No bright red bleeding per rectum or melena.  He took ibuprofen at noon today without relief.   ED Course: Upon presentation to the emergency room, BP was 148/101 with otherwise normal vital signs.  Labs revealed unremarkable CMP.  Lactic acid 0.8 and CBC showed leukocytosis 16.7 with neutrophilia.  Coag profile was within normal.  Blood cultures were drawn.   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 Focal rim enhancing 2.9 x 2.4 cm abscess in the midline. Significant surrounding changes of cellulitis involving the skin and subcutaneous fat.   The patient was ordered IV Zosyn.  General surgery consult was obtained and  recommendation was for IV antibiotics for now pending I&D.  Patient underwent I & D on 05/28/23 with placement of penrose drain.  Packing was removed on 05/29/23.  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 Mikki Harbor for d/c with Bactrim DS -add amox/clav x 8 more days -pt will follow up in office with Dr. Robyne Peers   Elevated BP -due to pain -not on any meds -improved with tx of abscess   Class 2 obesity -BMI 35.42 -lifestyle modification    Consultants: general surgery Procedures performed: none  Disposition: Home Diet recommendation:  Discharge Diet Orders (From admission, onward)     Start     Ordered   05/30/23 0000  Diet - low sodium heart healthy        05/30/23 0925           Regular diet DISCHARGE MEDICATION: Allergies as of 05/30/2023       Reactions   Vancomycin Itching, Other (See Comments)   Red-man syndrome        Medication List     TAKE these medications    acetaminophen 500 MG tablet Commonly known as: TYLENOL Take 2 tablets (1,000 mg total) by mouth every 6 (six) hours for 7 days.   acetaminophen 500 MG  tablet Commonly known as: TYLENOL Take 2 tablets (1,000 mg total) by mouth every 6 (six) hours for 7 days.   amoxicillin-clavulanate 875-125 MG tablet Commonly known as: AUGMENTIN Take 1 tablet by mouth every 12 (twelve) hours.   calcium carbonate 750 MG chewable tablet Commonly known as: TUMS EX Chew 1 tablet by mouth daily.   ibuprofen 200 MG tablet Commonly known as: ADVIL Take 200 mg by mouth every 6 (six) hours as needed for mild pain (pain score 1-3).   ondansetron 4 MG  tablet Commonly known as: Zofran Take 1 tablet (4 mg total) by mouth daily as needed for nausea or vomiting.   oxyCODONE 5 MG immediate release tablet Commonly known as: Roxicodone Take 1 tablet (5 mg total) by mouth every 6 (six) hours as needed.   sulfamethoxazole-trimethoprim 800-160 MG tablet Commonly known as: BACTRIM DS Take 1 tablet by mouth 2 (two) times daily for 8 days.        Follow-up Information     Pappayliou, Gustavus Messing, DO. Call.   Specialty: General Surgery Why: Call to schedule a follow up appointment in 2 weeks Contact information: [address] [place] [facility] [address] [phone]                Discharge Exam: Filed Weights   05/27/23 1628 05/27/23 2135 05/28/23 0923  Weight: 107 kg 108.8 kg 108.8 kg   HEENT:  [place]/AT, No thrush, no icterus CV:  RRR, no rub, no S3, no S4 Lung:  CTA, no wheeze, no rhonchi Abd:  soft/+BS, NT Ext:  No edema, no lymphangitis, no synovitis, no rash   Condition at discharge: stable  The results of significant diagnostics from this hospitalization (including imaging, microbiology, ancillary and laboratory) are listed below for reference.   Imaging Studies: MR PELVIS W WO CONTRAST  Result Date: 05/27/2023 CLINICAL DATA:  Inflammation of the right buttock region. Patient has a history of perianal fistula and perianal abscess. EXAM: MRI PELVIS WITHOUT AND WITH CONTRAST TECHNIQUE: Multiplanar multisequence MR imaging of the pelvis was performed both before and after administration of intravenous contrast. CONTRAST:  10mL GADAVIST GADOBUTROL 1 MMOL/ML IV SOLN COMPARISON:  Prior pelvic CT scan from 2019 is not available for direct comparison. FINDINGS: Urinary Tract: The bladder is unremarkable. No bladder mass or calculi. Bowel: The rectum, visualized sigmoid colon and pelvic small bowel loops are grossly normal. Vascular/Lymphatic: No vascular abnormalities. Small bilateral inguinal lymph nodes. No pelvic adenopathy.  Reproductive:  The prostate gland and seminal vesicles are. 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 There is a focal rim enhancing 2.9 x 2.4 cm abscess in the midline. Significant surrounding changes of cellulitis involving the skin and subcutaneous fat. Musculoskeletal: No significant bony findings. IMPRESSION: 1. 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 2. Focal rim enhancing 2.9 x 2.4 cm abscess in the midline. Significant surrounding changes of cellulitis involving the skin and subcutaneous fat. 3. No significant intrapelvic findings. Electronically Signed   By: Rudie Meyer M.D.   On: 05/27/2023 18:38    Microbiology: Results for orders placed or performed during the hospital encounter of 05/27/23  Culture, blood (x 2)     Status: None (Preliminary result)   Collection Time:  05/27/23  8:16 PM   Specimen: BLOOD  Result Value Ref Range Status   Specimen Description BLOOD BLOOD LEFT ARM  Final   Special Requests   Final    BOTTLES DRAWN AEROBIC AND ANAEROBIC Blood Culture adequate volume   Culture   Final    NO GROWTH 3 DAYS Performed at [facility], [address], [place], [place] [address]    Report Status PENDING  Incomplete  Culture, blood (x 2)     Status: None (Preliminary result)   Collection Time: 05/27/23  8:18 PM   Specimen: BLOOD  Result Value Ref Range Status   Specimen Description BLOOD BLOOD LEFT HAND  Final   Special Requests   Final    BOTTLES DRAWN AEROBIC ONLY Blood Culture adequate volume   Culture   Final    NO GROWTH 3 DAYS Performed at [facility], [address], [place], [place] [address]    Report Status PENDING  Incomplete  Surgical PCR  screen     Status: None   Collection Time: 05/27/23 10:49 PM   Specimen: Nasal Mucosa; Nasal Swab  Result Value Ref Range Status   MRSA, PCR NEGATIVE NEGATIVE Final   Staphylococcus aureus NEGATIVE NEGATIVE Final    Comment: (NOTE) The Xpert SA Assay (FDA approved for NASAL specimens in patients 57 years of age and older), is one component of a comprehensive surveillance program. It is not intended to diagnose infection nor to guide or monitor treatment. Performed at [facility], [address] [place], [place] [address]   Aerobic/Anaerobic Culture w Gram Stain (surgical/deep wound)     Status: None (Preliminary result)   Collection Time: 05/28/23 11:29 AM   Specimen: Path fluid; Body Fluid  Result Value Ref Range Status   Specimen Description   Final    ABSCESS PERIRECTAL Performed at [facility], [address], [place], [place] [address]    Special Requests   Final    NONE Performed at [facility], [address] [place], [place] [address]    Gram Stain   Final    ABUNDANT WBC PRESENT, PREDOMINANTLY PMN ABUNDANT GRAM NEGATIVE RODS ABUNDANT GRAM POSITIVE COCCI IN PAIRS    Culture   Final    RARE GRAM NEGATIVE RODS SUSCEPTIBILITIES TO FOLLOW HOLDING FOR POSSIBLE ANAEROBE Performed at [facility], [address], [place], [place] [address]    Report Status PENDING  Incomplete    Labs: CBC: Recent Labs  Lab 05/27/23 1653 05/28/23 0405 05/29/23 0408 05/30/23 0422  WBC 16.7* 14.1* 16.9* 13.0*  NEUTROABS 12.7*  --   --   --   HGB 15.3 13.5 13.8 13.4  HCT 45.8 40.8 42.3 39.9  MCV 83.4 84.8 83.6 84.0  PLT 365 317 358 368   Basic Metabolic Panel: Recent Labs  Lab 05/27/23 1653 05/30/23 0422  NA 135 137  K 3.7 3.7  CL 99 103  CO2 26 25  GLUCOSE 102* 104*  BUN 21* 17  CREATININE 0.83 0.87  CALCIUM 8.9 8.6*  MG  --  2.3   Liver Function Tests: Recent Labs  Lab 05/27/23 1653  AST 15  ALT 24  ALKPHOS 82  BILITOT 0.7  PROT 7.5  ALBUMIN  3.8   CBG: No results for input(s): "GLUCAP" in the last 168 hours.  Discharge time spent: greater than 30 minutes.  Signed: Catarina Hartshorn, MD [facility] 05/30/2023

## 2023-05-30 NOTE — Plan of Care (Signed)
  Problem: Education: Goal: Knowledge of General Education information will improve Description: Including pain rating scale, medication(s)/side effects and non-pharmacologic comfort measures Outcome: Completed/Met   Problem: Health Behavior/Discharge Planning: Goal: Ability to manage health-related needs will improve Outcome: Completed/Met   Problem: Clinical Measurements: Goal: Ability to maintain clinical measurements within normal limits will improve Outcome: Completed/Met Goal: Will remain free from infection Outcome: Completed/Met Goal: Diagnostic test results will improve Outcome: Completed/Met Goal: Respiratory complications will improve Outcome: Completed/Met Goal: Cardiovascular complication will be avoided Outcome: Completed/Met   Problem: Activity: Goal: Risk for activity intolerance will decrease Outcome: Completed/Met   Problem: Nutrition: Goal: Adequate nutrition will be maintained Outcome: Completed/Met   Problem: Coping: Goal: Level of anxiety will decrease Outcome: Completed/Met   Problem: Elimination: Goal: Will not experience complications related to bowel motility Outcome: Completed/Met Goal: Will not experience complications related to urinary retention Outcome: Completed/Met   Problem: Pain Management: Goal: General experience of comfort will improve Outcome: Completed/Met   Problem: Safety: Goal: Ability to remain free from injury will improve Outcome: Completed/Met   Problem: Skin Integrity: Goal: Risk for impaired skin integrity will decrease Outcome: Completed/Met   Problem: Fluid Volume: Goal: Hemodynamic stability will improve Outcome: Completed/Met   Problem: Clinical Measurements: Goal: Diagnostic test results will improve Outcome: Completed/Met Goal: Signs and symptoms of infection will decrease Outcome: Completed/Met   Problem: Respiratory: Goal: Ability to maintain adequate ventilation will improve Outcome:  Completed/Met

## 2023-05-30 NOTE — Progress Notes (Signed)
[facility] Progress Note  2 Days Post-Op  Subjective: Patient seen and examined.  He is resting comfortably in bed.  He states that his pain has improved since yesterday.  He has also been able to have a bowel movement.  Denies nausea and vomiting.  Objective: Vital signs in last 24 hours: Temp:  [97.4 F (36.3 C)-98.6 F (37 C)] 97.4 F (36.3 C) (11/28 0502) Pulse Rate:  [71-86] 71 (11/28 0502) Resp:  [18-20] 20 (11/28 0502) BP: (121-127)/(70-73) 125/73 (11/28 0502) SpO2:  [98 %-100 %] 99 % (11/28 0502) Last BM Date : 05/29/23  Intake/Output from previous day: 11/27 0701 - 11/28 0700 In: 600 [P.O.:600] Out: 600 [Urine:600] Intake/Output this shift: No intake/output data recorded.  General appearance: alert, cooperative, and no distress Anorectal: Erythema and induration of the right buttock continues to improve, improving tenderness around anus and at Penrose drain site, no significant purulent drainage  Lab Results:  Recent Labs    05/29/23 0408 05/30/23 0422  WBC 16.9* 13.0*  HGB 13.8 13.4  HCT 42.3 39.9  PLT 358 368   BMET Recent Labs    05/27/23 1653 05/30/23 0422  NA 135 137  K 3.7 3.7  CL 99 103  CO2 26 25  GLUCOSE 102* 104*  BUN 21* 17  CREATININE 0.83 0.87  CALCIUM 8.9 8.6*   PT/INR Recent Labs    05/27/23 2016 05/28/23 0405  LABPROT 13.1 13.9  INR 1.0 1.1    Studies/Results: No results found.  Anti-infectives: Anti-infectives (From admission, onward)    Start     Dose/Rate Route Frequency Ordered Stop   05/27/23 2000  piperacillin-tazobactam (ZOSYN) IVPB 3.375 g        3.375 g 12.5 mL/hr over 240 Minutes Intravenous Every 8 hours 05/27/23 1948         Assessment/Plan:  Patient is a 32 year old male who was admitted with a perirectal abscess and concern for fistula.  He is status post incision and drainage of perirectal abscess with drain placement on 11/26.  -Patient's leukocytosis is improving, 13 from  16.9 -Patient will need a total of 10 days of antibiotic treatment.  Will likely send him home with Bactrim given gram-positive cocci in pairs and gram-negative rods on Gram stain -Regular diet -PRN pain control and antiemetics -Will continue bowel regimen as an outpatient -Advised patient that he will need to perform sitz bath several times a day, and especially after having bowel movements to try and keep the area clean -Patient is stable for discharge home from a general surgery standpoint.  He will come and see me in 2 weeks for follow-up   LOS: 3 days    Muna Demers A Anjelika Ausburn 05/30/2023

## 2023-05-30 NOTE — Plan of Care (Signed)
  Problem: Education: Goal: Knowledge of General Education information will improve Description: Including pain rating scale, medication(s)/side effects and non-pharmacologic comfort measures Outcome: Progressing   Problem: Health Behavior/Discharge Planning: Goal: Ability to manage health-related needs will improve Outcome: Progressing   Problem: Clinical Measurements: Goal: Ability to maintain clinical measurements within normal limits will improve Outcome: Progressing   Problem: Activity: Goal: Risk for activity intolerance will decrease Outcome: Progressing   Problem: Coping: Goal: Level of anxiety will decrease Outcome: Progressing   Problem: Elimination: Goal: Will not experience complications related to bowel motility Outcome: Progressing   Problem: Pain Management: Goal: General experience of comfort will improve Outcome: Progressing   Problem: Skin Integrity: Goal: Risk for impaired skin integrity will decrease Outcome: Progressing

## 2023-05-30 NOTE — Progress Notes (Signed)
Pharmacy Antibiotic Note  Tim Tucker is a 32 y.o. male admitted on 05/27/2023 with cellulitis.  Patient presented to ED for evaluation of rectal bleeding and rectal pain that started Thursday.  He reports that he notices blood with wiping and in the toilet. He reports that the last time this happened he had I&D for perirectal abscess and symptoms feel similar. Pharmacy has been consulted for Zosyn dosing.  Plan:  Zosyn 3.375 g IV q8h   Height: 5\' 9"  (175.3 cm) Weight: 108.8 kg (239 lb 13.8 oz) IBW/kg (Calculated) : 70.7  Temp (24hrs), Avg:97.9 F (36.6 C), Min:97.4 F (36.3 C), Max:98.6 F (37 C)  Recent Labs  Lab 05/27/23 1653 05/27/23 2016 05/28/23 0405 05/29/23 0408 05/30/23 0422  WBC 16.7*  --  14.1* 16.9* 13.0*  CREATININE 0.83  --   --   --  0.87  LATICACIDVEN  --  0.8  --   --   --     Estimated Creatinine Clearance: 148.1 mL/min (by C-G formula based on SCr of 0.87 mg/dL).    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    Antimicrobials this admission:  Zosyn 11/25 >>    Microbiology results: 11/25 Bcx: ngtd Abscess Cx: gram neg rods and gram + cocci  Thank you for allowing pharmacy to be a part of this patient's care.  Tad Moore, PharmD Clinical Pharmacist 05/30/2023 8:48 AM

## 2023-05-30 NOTE — Progress Notes (Signed)
Went over discharge instructions w/ pt.

## 2023-05-31 ENCOUNTER — Telehealth (INDEPENDENT_AMBULATORY_CARE_PROVIDER_SITE_OTHER): Payer: Self-pay | Admitting: Surgery

## 2023-05-31 DIAGNOSIS — K611 Rectal abscess: Secondary | ICD-10-CM

## 2023-05-31 LAB — AEROBIC/ANAEROBIC CULTURE W GRAM STAIN (SURGICAL/DEEP WOUND)

## 2023-05-31 MED ORDER — POLYETHYLENE GLYCOL 3350 17 G PO PACK: 17.0000 g | PACK | Freq: Every day | ORAL | 0 refills | Status: DC

## 2023-05-31 MED ORDER — DOCUSATE SODIUM 100 MG PO CAPS: 100.0000 mg | ORAL_CAPSULE | Freq: Two times a day (BID) | ORAL | 0 refills | Status: AC

## 2023-05-31 NOTE — Telephone Encounter (Signed)
[facility]  Called to update the patient regarding his cultures.  He had both E. Coli and Bacteroides Thetaiotaomicron on his culture.  He was discharged on both Bactrim and Augmentin.  These should cover the bacteria found in the culture.  He also inquired about a bowel regimen.  I prescribed Colace BID and Miralax.  All questions were answered to his expressed satisfaction.  Theophilus Kinds, DO [facility] [address] [place], [place] [address] [phone] (office)

## 2023-06-01 LAB — CULTURE, BLOOD (ROUTINE X 2)
Culture: NO GROWTH
Culture: NO GROWTH
Special Requests: ADEQUATE
Special Requests: ADEQUATE

## 2023-06-11 ENCOUNTER — Ambulatory Visit (INDEPENDENT_AMBULATORY_CARE_PROVIDER_SITE_OTHER): Payer: No Typology Code available for payment source | Admitting: Surgery

## 2023-06-11 ENCOUNTER — Encounter: Payer: Self-pay | Admitting: Surgery

## 2023-06-11 ENCOUNTER — Other Ambulatory Visit: Payer: Self-pay

## 2023-06-11 VITALS — BP 130/86 | HR 88 | Temp 98.0°F | Resp 16 | Ht 69.0 in | Wt 245.0 lb

## 2023-06-11 DIAGNOSIS — K611 Rectal abscess: Secondary | ICD-10-CM

## 2023-06-11 MED ORDER — OXYCODONE HCL 5 MG PO TABS: 5.0000 mg | ORAL_TABLET | Freq: Four times a day (QID) | ORAL | 0 refills | Status: DC | PRN

## 2023-06-11 NOTE — Progress Notes (Signed)
[facility] Note   HPI:  32 y.o. Male presents to clinic for post-op follow-up status post incision and drainage of perirectal abscess with drain placement on 11/26.  His pain has slightly improved, though he does have increased pain when he has to strain with bowel movements.  He denies any fevers or chills.  He continues to have brown/purulent drainage around his drain.  Review of Systems:  All other review of systems: otherwise negative   Vital Signs:  BP 130/86   Pulse 88   Temp 98 F (36.7 C) (Oral)   Resp 16   Ht 5\' 9"  (1.753 m)   Wt 245 lb (111.1 kg)   SpO2 97%   BMI 36.18 kg/m    Physical Exam:  Physical Exam Vitals reviewed.  Constitutional:      Appearance: Normal appearance.  Genitourinary:    Comments: Significantly improved erythema and induration around patient's anus, Penrose drain in place from drainage site, without any surrounding induration or erythema, minimal drainage noted on exam Neurological:     Mental Status: He is alert.     Laboratory studies: None  Imaging:  None  Assessment:  32 y.o. yo Male who presents for follow-up status post incision and drainage of perirectal abscess with drain placement on 11/26  Plan:  -Discussed that since the patient is continuing to have brown/purulent drainage, we should likely not remove his drain at this time, as if the incision sites closed up, his abscess is likely to recur. -Will plan to have the patient follow-up with me after the new year, and if he continues to have brown/purulent drainage, will have him follow-up with colorectal surgery -Referral sent to colorectal surgery who is in network with patient, to get him scheduled and prevent increased delay between our next follow-up visit and when he follows up with colorectal surgery.   -We discussed that he may continue to have drainage, as this could have been a fistula-in-ano at the time of surgery.  Also discussed that patient will  likely need colonoscopy at some point in the future to rule out any kind of inflammatory bowel disease as cause of his anal disease -Follow up with me in 2 to 3 weeks  All of the above recommendations were discussed with the patient, and all of patient's questions were answered to his expressed satisfaction.  Tim Kinds, DO [facility] [address] [place], [place] [address] [phone] (office)

## 2023-06-11 NOTE — Patient Instructions (Addendum)
-  Continue with Sitz baths -Continue to take stool softener daily -Also take an over the counter laxative as needed for constipation -Take Tylenol or Ibuprofen as needed for pain -Take Oxycodone for breakthrough pain

## 2023-07-04 ENCOUNTER — Encounter: Payer: No Typology Code available for payment source | Admitting: Surgery

## 2023-07-10 ENCOUNTER — Encounter: Payer: No Typology Code available for payment source | Admitting: Surgery

## 2024-04-02 ENCOUNTER — Encounter (HOSPITAL_BASED_OUTPATIENT_CLINIC_OR_DEPARTMENT_OTHER): Payer: Self-pay | Admitting: Internal Medicine

## 2024-04-02 DIAGNOSIS — R0683 Snoring: Secondary | ICD-10-CM

## 2024-04-02 DIAGNOSIS — R5383 Other fatigue: Secondary | ICD-10-CM

## 2024-04-02 DIAGNOSIS — R0681 Apnea, not elsewhere classified: Secondary | ICD-10-CM

## 2024-04-05 ENCOUNTER — Other Ambulatory Visit: Payer: Self-pay

## 2024-04-05 ENCOUNTER — Emergency Department (HOSPITAL_COMMUNITY)
Admission: EM | Admit: 2024-04-05 | Discharge: 2024-04-05 | Disposition: A | Discharge status: Home / Self Care | Attending: Emergency Medicine | Admitting: Emergency Medicine

## 2024-04-05 ENCOUNTER — Encounter (HOSPITAL_COMMUNITY): Payer: Self-pay | Admitting: *Deleted

## 2024-04-05 DIAGNOSIS — I10 Essential (primary) hypertension: Secondary | ICD-10-CM | POA: Insufficient documentation

## 2024-04-05 DIAGNOSIS — R569 Unspecified convulsions: Secondary | ICD-10-CM | POA: Diagnosis present

## 2024-04-05 DIAGNOSIS — G40209 Localization-related (focal) (partial) symptomatic epilepsy and epileptic syndromes with complex partial seizures, not intractable, without status epilepticus: Secondary | ICD-10-CM | POA: Diagnosis not present

## 2024-04-05 HISTORY — DX: Unspecified convulsions: R56.9

## 2024-04-05 HISTORY — DX: Essential (primary) hypertension: I10

## 2024-04-05 LAB — COMPREHENSIVE METABOLIC PANEL WITH GFR
ALT: 21 U/L (ref 0–44)
AST: 18 U/L (ref 15–41)
Albumin: 3.7 g/dL (ref 3.5–5.0)
Alkaline Phosphatase: 73 U/L (ref 38–126)
Anion gap: 10 (ref 5–15)
BUN: 15 mg/dL (ref 6–20)
CO2: 25 mmol/L (ref 22–32)
Calcium: 9.1 mg/dL (ref 8.9–10.3)
Chloride: 102 mmol/L (ref 98–111)
Creatinine, Ser: 0.87 mg/dL (ref 0.61–1.24)
GFR, Estimated: >60 mL/min (ref >60)
Glucose, Bld: 113 mg/dL — ABNORMAL HIGH (ref 70–99)
Potassium: 4 mmol/L (ref 3.5–5.1)
Sodium: 137 mmol/L (ref 135–145)
Total Bilirubin: 0.5 mg/dL (ref 0.0–1.2)
Total Protein: 6.8 g/dL (ref 6.5–8.1)

## 2024-04-05 LAB — URINALYSIS, ROUTINE W REFLEX MICROSCOPIC
Bilirubin Urine: NEGATIVE
Glucose, UA: NEGATIVE mg/dL
Hgb urine dipstick: NEGATIVE
Ketones, ur: NEGATIVE mg/dL
Leukocytes,Ua: NEGATIVE
Nitrite: NEGATIVE
Protein, ur: NEGATIVE mg/dL
Specific Gravity, Urine: 1.019 (ref 1.005–1.030)
pH: 6 (ref 5.0–8.0)

## 2024-04-05 LAB — CBC
HCT: 46.3 % (ref 39.0–52.0)
Hemoglobin: 15.6 g/dL (ref 13.0–17.0)
MCH: 27.8 pg (ref 26.0–34.0)
MCHC: 33.7 g/dL (ref 30.0–36.0)
MCV: 82.4 fL (ref 80.0–100.0)
Platelets: 377 K/uL (ref 150–400)
RBC: 5.62 MIL/uL (ref 4.22–5.81)
RDW: 12.3 % (ref 11.5–15.5)
WBC: 14.3 K/uL — ABNORMAL HIGH (ref 4.0–10.5)
nRBC: 0 % (ref 0.0–0.2)

## 2024-04-05 LAB — MAGNESIUM: Magnesium: 1.9 mg/dL (ref 1.7–2.4)

## 2024-04-05 MED ORDER — LACOSAMIDE 100 MG PO TABS: 100.0000 mg | ORAL_TABLET | Freq: Two times a day (BID) | ORAL | 1 refills | Status: DC | PRN

## 2024-04-05 MED ORDER — LACOSAMIDE 50 MG PO TABS
100.0000 mg | ORAL_TABLET | Freq: Two times a day (BID) | ORAL | Status: DC
  Administered 2024-04-05: 100 mg via ORAL
  Filled 2024-04-05: qty 2

## 2024-04-05 MED ORDER — SODIUM CHLORIDE 0.9 % IV SOLN
300.0000 mg | Freq: Once | INTRAVENOUS | Status: AC
  Administered 2024-04-05: 300 mg via INTRAVENOUS
  Filled 2024-04-05: qty 30

## 2024-04-05 NOTE — Consult Note (Signed)
 NEUROLOGY CONSULT NOTE   Date of service: April 05, 2024 Patient Name: Tim Tucker MRN:  [id number] DOB:  February 02, 1991 Chief Complaint: Breakthrough seizures Requesting Provider: No att. providers found  History of Present Illness  Tim Tucker is a 33 y.o. male with hx of RMSF encephalitis with subsequent epilepsy (GTC, staring episodes) (records unavailable), left knee ligament tear, HTN, hidradrenitis suppurativa, anal fistula, who presents with breakthrough seizures.  Wife assisted with history over the phone.  His seizures restarted about a month ago. He originally presented with seizures in 2023 and found to have RMSF encephalitis. He was subsequently placed on Keppra  with good control. After 6 months of being seizure free, Keppra  was discontinued. Has been off of Keppra  for about a year, however started having seizures about a month ago. Last seen by Dr. Pastor Falling on 06/19/2022. At that time, the plan was to continue Keppra  1500mg  BID as well as Lamictal  150mg  BID.  Seizure semiology: - GTC's, unknown if focal onset, usually in the morning or during his sleep. +Urinary incontinence. Avg duration 1-2 minutes, have never been close to 5 minutes.  - Staring episodes that last for 1 minute with finger automatisms and post-ictal period c/w complex partial  Interestingly, after both types of seizures, report his RLE will have jerking for the rest of the day. 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   Denies any changes in medication, illness, trauma or other identifiable triggers for onset a month ago. Does report that he is under evaluation for sleep apnea (home test was inconclusive) so it is possible his sleep apnea is worsening as a trigger.   No heart disease in himself or his family other than paternal grandfather had heart failure. Has 4 siblings, all  without heart issues.   He presented earlier today to another facility and was restarted on Keppra , unsure if loaded, however he reports anger issues as a side effect and would like to change medication.    ROS  Comprehensive ROS performed and pertinent positives documented in HPI   Past History   Past Medical History:  Diagnosis Date   Hypertension    Seizures (HCC)     Past Surgical History:  Procedure Laterality Date   APPENDECTOMY     INCISION AND DRAINAGE ABSCESS N/A 05/28/2023   Procedure: INCISION AND DRAINAGE ABSCESS WITH PENROSE DRAIN;  Surgeon: Evonnie Dorothyann LABOR, DO;  Location: [facility];  Service: General;  Laterality: N/A;   INCISION AND DRAINAGE PERIRECTAL ABSCESS N/A 04/19/2017   Procedure: IRRIGATION AND DEBRIDEMENT PERIRECTAL ABSCESS;  Surgeon: Ethyl Lenis, MD;  Location: [facility];  Service: General;  Laterality: N/A;    Family History: Family History  Problem Relation Age of Onset   Hypertension Mother    Heart failure Mother    Hypertension Father    Heart failure Father     Social History  reports that he has never smoked. He has never used smokeless tobacco. He reports that he does not drink alcohol and does not use drugs.  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    Medications   Current Facility-Administered Medications:    lacosamide (VIMPAT) tablet 100 mg, 100 mg, Oral, BID, Ellenor Wisniewski M, MD, 100 mg at 04/05/24 2046  Current Outpatient Medications:    Lacosamide 100 MG  TABS, Take 1 tablet (100 mg total) by mouth 2 (two) times daily as needed., Disp: 60 tablet, Rfl: 1   levETIRAcetam  (KEPPRA ) 500 MG tablet, Take 1,500 mg by mouth 2 (two) times daily., Disp: , Rfl:    nebivolol (BYSTOLIC) 10 MG tablet, Take 10 mg by mouth daily., Disp: , Rfl:    predniSONE  (DELTASONE ) 20 MG tablet, Take 40 mg by mouth every morning., Disp: , Rfl:    sertraline (ZOLOFT) 100 MG tablet, Take 100 mg by mouth  daily., Disp: , Rfl:    triamcinolone cream (KENALOG) 0.1 %, Apply 1 Application topically daily as needed., Disp: , Rfl:    WEGOVY 2.4 MG/0.75ML SOAJ SQ injection, Inject 2.4 mg into the skin once a week., Disp: , Rfl:    oxyCODONE  (ROXICODONE ) 5 MG immediate release tablet, Take 1 tablet (5 mg total) by mouth every 6 (six) hours as needed. (Patient not taking: Reported on 2024/04/12), Disp: 16 tablet, Rfl: 0  Vitals   Vitals:   04/12/2024 1845 2024-04-12 2116 12-Apr-2024 2130 Apr 12, 2024 2215  BP: 124/85 (!) 140/85 125/71   Pulse: 94 90 90 86  Resp: 20 (!) 22 19 17   Temp:  98.2 F (36.8 C)    TempSrc:  Oral    SpO2: 97% 99% 98% 99%  Weight:      Height:        Body mass index is 36.17 kg/m.   Physical Exam   Constitutional: Appears well-developed and well-nourished.  Psych: Affect appropriate to situation.  Eyes: No scleral injection.  HENT: No OP obstruction.  Head: Normocephalic.  Cardiovascular: Normal rate and regular rhythm.  Respiratory: Effort normal, non-labored breathing.  GI: Soft.  No distension. There is no tenderness.  Skin: WDI.   Neurologic Examination   Mental status: alert, oriented to person, place and time. Able to provide history. Speech: no dysarthria, word-finding difficulty, paraphasic errors. Cranial nerves: PERRL EOMI VF full Face sensation intact bilaterally. Face symmetric at rest and with activation. Hearing grossly intact. Palate elevation symmetric Tongue protrudes midline and has full range of motion. SCM's full strength bilaterally. Motor: Normal bulk and tone. No abnormal movements RUE: shoulder abduction 4/5, biceps 4+/5, triceps 4/5, wrist flexion 4+/5, wrist extension 4+/5, hand grip 4/5, finger extension 4/5 LUE: shoulder abduction 5/5, biceps 5/5, triceps 5/5, wrist flexion 5/5, wrist extension 5/5, hand grip 5/5 RLE: hip flexion 4+/5, knee flexion 4+/5, knee extension 4+/5, ankle dorsiflexion 5/5, plantar flexion 5/5 LLE: hip  flexion 5/5, knee flexion 4/5, knee extension 4/5, ankle dorsiflexion 5/5, plantar flexion 5/5 Sensory: Grossly intact to light touch throughout. Romberg negative. Reflexes: DTR's 1+ on RUE and RLE, 2+ on L side. No clonus. Mute toes bilaterally. Coordination: FTN intact. HTS intact. Gait: Normal.   Labs/Imaging/Neurodiagnostic studies   CBC:  Recent Labs  Lab 04/12/2024 1700  WBC 14.3*  HGB 15.6  HCT 46.3  MCV 82.4  PLT 377   Basic Metabolic Panel:  Lab Results  Component Value Date   NA 137 04-12-24   K 4.0 2024-04-12   CO2 25 04-12-24   GLUCOSE 113 (H) Apr 12, 2024   BUN 15 04-12-24   CREATININE 0.87 12-Apr-2024   CALCIUM 9.1 2024/04/12   GFRNONAA >60 April 12, 2024   GFRAA >60 04/19/2017   Lipid Panel: No results found for: LDLCALC HgbA1c: No results found for: HGBA1C Urine Drug Screen: No results found for: LABOPIA, COCAINSCRNUR, LABBENZ, AMPHETMU, THCU, LABBARB  Alcohol Level No results found for: [facility] INR  Lab Results  Component  Value Date   INR 1.1 05/28/2023   APTT  Lab Results  Component Value Date   APTT 28 05/27/2023   AED levels:  Lab Results  Component Value Date   LAMOTRIGINE  3.7 06/19/2022   LEVETIRACETA 33.4 06/19/2022    MRI Brain w/wo 03/20/2022 (Personally reviewed): Unremarkable  Neurodiagnostics rEEG:  N/A  ASSESSMENT   Areon Cocuzza is a 33 y.o. male with reported history of RMSF encephalitis c/b epilepsy. Records of hospital admission for RMSF not available, nor records of encephalitis diagnosis. Also unclear who advised him to stop medication as per Dr. Loise last note he recommended increasing medication and actually had him on both Keppra  and Lamictal .   Nonetheless, patient does have episodes consistent with complex partial seizures as well as focal to generalized tonic clonic. As patient unable to tolerate Keppra  rage and as he is young, hesitant to start B6 supplementation due to possible  neuropathic side effects of long term use. Therefore, will obtain EKG to consider Vimpat.  RECOMMENDATIONS  - EKG with PR interval 140 - Load Vimpat 300mg  now, maintenance to be 100mg  BID - If brand name is too expensive, generic is available. - Patient reports has appointment scheduled for [facility] already but it is in 2-3 weeks - Educated patient on seizure precautions including that as per [place] law, it is illegal to drive within 6 months of most recent seizure that causes altered mental status. Also advised to avoid any activities that would be dangerous if he were to have a seizure, such as bathing unchaperoned, climbing ladders, operating heavy machinery, grilling, open flames.  ______________________________________________________________________    Signed, Halie Gass M Dawnisha Marquina, MD [facility]

## 2024-04-05 NOTE — ED Provider Notes (Signed)
 [facility]  Emergency Department Provider Note     History   Chief Complaint Seizure - Prior History Of   HPI  Tim Tucker is a 33 y.o. male dents with a seizure episode.  Patient was here couple days ago with a seizure episode.  He has had seizure disorder after being exposed to a tick bite approximately 3 to 4 years ago.  Patient was then seen by a physician and a neurologist and started on antiseizure medication he says and then it was stopped approximately a year ago since he no longer had any seizures.  He has not seen a neurologist since then which was about a year ago.  He has not been on any seizure medications until it was restarted couple of days ago when he was here and seen for seizure.  Patient is currently taking Keppra  500 mg twice a day.  Today when he was putting on his pants in the living room he had a seizure episode according to him he does not have any family members or any friends at bedside to explain or give details of the seizure.  There is no history of any tongue bites no incontinence of urine or bowel.  He did not injure any part of his body.  He denies any headache he denies any recent illness no recent fever chills no recent nausea vomiting no neck pain or injury no injury to his face no injuries to his arms or shoulders he denies any chest or rib injury denies any back injury or pain no abdominal pain or injury no injury to his pelvis or hips or legs.    Past Medical History: No date: Anal fistula No date: Hydradenitis No date: Hypertension No date: Seizures    (CMS-HCC)  No past surgical history on file.  Prior to Admission medications  Medication Dose, Route, Frequency  amlodipine (NORVASC) 10 MG tablet 10 mg, Oral, Daily (standard)  amlodipine (NORVASC) 10 MG tablet 10 mg, Oral, Daily (standard)  benazepril (LOTENSIN) 10 MG tablet 10 mg, Oral, Daily (standard)  cyclobenzaprine (FLEXERIL) 5 MG tablet 5 mg, Oral, 3 times a day PRN   hydrOXYzine  (VISTARIL ) 25 MG capsule 25 mg, Oral, Nightly  levETIRAcetam  (KEPPRA ) 500 MG tablet 500 mg, Oral, 2 times a day (standard)  methocarbamoL  (ROBAXIN ) 500 MG tablet 500 mg, Oral, 2 times a day (standard)  methylPREDNISolone (MEDROL DOSEPACK) 4 mg tablet follow package directions Patient not taking: No sig reported  nebivoloL (BYSTOLIC) 10 MG tablet 10 mg, Oral, Daily (standard)    Allergies Vancomycin analogues   Short Social History[1]  Review of Systems  Neurological:  Positive for seizures.    As in HPI, all systems reviewed and otherwise negative.  Physical Exam    Vitals:   04/05/24 1014  BP: 142/82  Pulse: 82  Resp: 20  Temp: 36.7 C (98 F)  TempSrc: Temporal  SpO2: 98%  Weight: 99.3 kg (219 lb)  Height: 175.3 cm (5' 9)     Physical Exam  Constitutional: Patient appears well-developed and well nourished. Non toxic in appearance. HEENT: Unremarkable.  No tongue bite no lip bite.  No facial tenderness elicited no ecchymosis no hematoma.  Head is normocephalic atraumatic.  Cervical spine is nontender. Head: Atraumatic.  Eyes: Normal ocular movements.  Negative raccoon eyes.  Positive PERRLA positive EOMI bilaterally. Neck: Supple with normal range of motion.  No cervical spine tenderness Pulmonary/Chest: Effort normal. No respiratory distress.  No tenderness of the chest wall.  No tenderness of the torso or back.  Cervical spine is nontender. Abdominal: Soft and non tender abdomen. Musculoskeletal: Extremities atraumatic. Neurological: Alert with no focal neurological deficit. Ambulatory with a steady gait.  He is alert awake and able to answer questions appropriately and normally.  There is no somnolence.  There is no lethargy. Skin: Warm and dry.  Nursing note and vital signs reviewed.   ED Course      Is a very well-appearing 33 year old male who has not seen a neurologist for 1 year.  He was here couple days ago with seizure episode started on  Keppra  500 mg twice a day.  He is interested to find out if he can switch to another medication.  I have advised him that right now we would just go up with the dosing to 1000 mg twice a day.  I will give him a loading dose here in the ED.  Keppra  level is ordered.  Procedures  Medications ordered during this encounter  Medications  . levETIRAcetam  (KEPPRA ) injection 1,000 mg    ED Results No results found for any visits on 04/05/24.  Radiology No results found.   Medical Decision Making   I have reviewed the vital signs and the nursing notes. Labs and radiology results that were available during my care of the patient were independently reviewed by me and considered in my medical decision making.    This is a well-appearing 33 year old male without any distress or discomfort physical examination is completely normal.  Loading dose of Keppra  will be given.  Medical Decision Making Amount and/or Complexity of Data Reviewed Labs: ordered.  Risk Prescription drug management.     Differential Diagnosis: Seizure, intractable seizure, tonic-clonic seizure,    ED Clinical Impression   Final diagnoses:  None   Seizure disorder  Procedures      This record has been created using [employer]. Chart creation errors have been sought, but may not always have been located. Such creation errors do not reflect on the standard of medical care.       [1] Social History Tobacco Use  . Smoking status: Every Day    Types: e-Cigarettes  . Smokeless tobacco: Never  Vaping Use  . Vaping status: Every Day  . Substances: Nicotine  Substance Use Topics  . Alcohol use: Not Currently  . Drug use: Yes    Types: Marijuana   Maree Jonelle Lash, MD 04/05/24 1118

## 2024-04-05 NOTE — ED Notes (Signed)
 The pt has been having seizures while sitting waiting to be triage he has had  keppra  xa 2 doses

## 2024-04-05 NOTE — Discharge Instructions (Addendum)
 ### Seizure Safety and Vimpat     **You are starting Vimpat (lacosamide) 100 mg twice daily to help control your seizures.** This medicine works best when taken exactly as prescribed. Missing doses can lead to more seizures, so it's important to take it every day at the same times.[1]      **What to expect with Vimpat:**      - Some people feel dizzy, sleepy, or have trouble with balance when starting Vimpat. Be careful until you know how it affects you. Do not drive or use heavy machinery until you feel safe and alert.[1]      - Rarely, Vimpat can affect your heart rhythm. Tell your doctor right away if you feel faint, have chest pain, or notice a fast or irregular heartbeat.[1]      - Like other seizure medicines, Vimpat may increase the risk of mood changes or thoughts of self-harm. Let your doctor know if you feel unusually sad, anxious, or have any concerning thoughts.[1][2]      **Seizure Safety Precautions:**      - **Take showers instead of baths** to lower the risk of drowning if a seizure happens.[3]      - **Do not swim alone.** Always have someone nearby who knows what to do if you have a seizure.[3]      - **Avoid heights and dangerous machinery.** Do not climb ladders or work with heavy equipment.[3]      - **Keep your home safe.** Use soft flooring where possible, pad sharp corners, and avoid locking bathroom doors.      - **Let friends, family, and coworkers know** about your seizures and what to do if you have one.      - **If you feel a seizure coming,** sit or lie down to prevent injury.      **What to do if someone sees you having a seizure:**      - Move objects away to prevent injury.      - Do not hold you down or put anything in your mouth.[4]      - Turn you gently onto your side (recovery position) if possible.      - Call 911 if the seizure lasts more than 5 minutes, you are injured, have trouble breathing, or if another seizure starts soon after the  first.[4]      **Driving Precautions in [place] :**      - **You must not drive until you have been seizure-free for at least 3 months.** This is the minimum required by   law and national guidelines.[5][6]      - If your seizures were only due to missing medication and you are now back on your medicine, you may be able to resume driving sooner, but this must be discussed with your doctor and the DMV.[5]      - You are responsible for reporting your seizure history to the [facility]. Driving before you are cleared can result in legal consequences and puts you and others at risk.[3][6]      - If you have side effects from Vimpat (like dizziness or sleepiness), do not drive until these are hnwz.[8]      **Other Tips:**      - Get enough sleep and avoid alcohol, as these can trigger seizures.[7]      - Carry a medical alert card or bracelet that says you have epilepsy.      - Keep a seizure diary to track your seizures and  any side effects.      **If you have questions or concerns, contact your healthcare provider.**      ### References  1. Vimpat. Food and Drug Administration. Updated date: 2024-02-19. 2. Vimpat. Food and Drug Administration. Updated date: 2018-01-21. 3. New-Onset Seizure in Adults and Adolescents: A Review. Brian RADDLE, Schuele SU. JAMA. 2016;316(24):2657-2668. doi:10.1001/jama.7983.81374. 4. 2024 American Heart Association and American Red Cross Guidelines for First Aid. Hewett Brumberg EK, Douma MJ, Alibertis K, et al. Circulation. 2024;150(24):e519-e579. doi:10.1161/CIR.0000000000001281. 5. Seizures, Driver Licensure, and Medical Reporting Update: An AAN Position Statement. Tolchin B, Krauss GL, Spanaki MV, et al. Neurology. 2025;104(7):e213459. doi:10.1212/WNL.9999999999786540. 6. Individual State Driving Restrictions for People With Epilepsy in the [place] . Colby RICHARDSON, Ampaw L, Krumholz A. Neurology. 2001;57(10):1780-5. doi:10.1212/wnl.57.10.1780. 7. Initial  Management of Seizure in Adults. Smith PEM. The [place] Journal of Medicine. 2021;385(3):251-263. doi:10.1056/NEJMcp2024526.

## 2024-04-05 NOTE — ED Triage Notes (Signed)
 Pt ambulatory to ED reporting seizures, states he has been having 3 per day for the last few weeks. Reports he went to another hospital and they increased his Keppra  but he doesn't feel like it is helping.

## 2024-04-05 NOTE — ED Provider Triage Note (Signed)
 Emergency Medicine Provider Triage Evaluation Note  Tim Tucker , a 33 y.o. male  was evaluated in triage.  Pt complains of seizures.  Review of Systems  Positive: Seizure-like activity, incontinence Negative: Shortness of breath, chest pain, dizziness,  Physical Exam  BP (!) 150/88 (BP Location: Right Arm)   Pulse 95   Temp 98.4 F (36.9 C)   Resp 18   Ht 5' 9 (1.753 m)   Wt 111.1 kg   SpO2 100%   BMI 36.17 kg/m  Gen:   Awake, no distress, afebrile, nontoxic appearing Resp:  Normal effort, clear to auscultation all fields MSK:   Moves extremities without difficulty, ambulatory without assistance Other:  No active seizure-like activity noted in triage  Medical Decision Making  Medically screening exam initiated at 5:08 PM.  Appropriate orders placed.  Tyjon Bowen was informed that the remainder of the evaluation will be completed by another provider, this initial triage assessment does not replace that evaluation, and the importance of remaining in the ED until their evaluation is complete.  33 year old male presents to the ED with complaints of seizure-like activity for the last month.  Patient reports he was diagnosed with seizures following a tick bite where he was [facility] spotted fever and Lyme disease.  He was placed on Keppra  500 mg twice daily with resolution of seizures.  Patient went 6 months without a seizure and was weaned off the medication by neurology.  Patient reports the last month he has been having 10 seizures per day.  Patient reports some seizures he will stare off, some he will have unilateral leg shaking, some he will have full body shaking, he also reports waking up to incontinence in his bed in the mornings.  Patient reports he went to ED in the [place] earlier this week and was placed back on Keppra  500 twice daily.  Patient reports he was still having the symptoms of seizure-like activity and went back to the ED in the [facility] today  and was given 1000 mg of IV Keppra  and advised to go to a bigger hospital.  Patient reports he has taken his 500 of Keppra  this morning and received the 1000 mg IV at the ED.  Patient sitting comfortably in triage room in no acute distress.  He does advise his last seizure was 10 minutes ago in triage, nurse did not note any seizure-like activity.  Patient reports no postictal like stage following seizures.   Myriam Fonda RAMAN, [place] 04/05/24 1714

## 2024-04-05 NOTE — ED Provider Notes (Signed)
 [place] EMERGENCY DEPARTMENT AT [facility] Provider Note   CSN: [id number] Arrival date & time: 04/05/24  1627     Patient presents with: Seizures   Tim Tucker is a 33 y.o. male who presents the emergency department with a chief complaint of seizures.  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  Patient reports that he was seizure-free for 6 months and his neurologist tapered him off of the Keppra .  He states that Keppra  makes him have severe anger problems and he does not like taking it.  He apparently started having seizures about a month ago but did not want to go back on his Keppra .  He reports that he is having up to 10 episodes of absence seizure like seizures throughout the day and that in the morning he occasionally has tonic-clonic seizures.  He did begin taking his Keppra  again this week but states that it has not managed his seizures.  He went to [facility] [place] this morning and had blood work done and was discharged on 15 mg 100 mg of Keppra  daily and told to call his primary care doctor for referral to neurology.  Patient reports that he was hoping he might get a change in some medications.  He drives a motorcycle because he is part of a [employer] and is can continue to drive despite having seizures every day.  I warned the patient that this was a terrible idea and that legally he has responsible   for discontinuing driving until he is at least 6 months seizure-free. He states that he also knows he is having seizures at night because he had some urinary incontinence several times which is very abnormal for him.  He denies severe headache or other neurologic abnormalities.  He reports that he had a seizure just prior to being evaluated while trying to text his wife because there is a period of time he does not remember.    Seizures       Prior to Admission medications   Medication Sig Start Date End Date Taking? Authorizing Provider  oxyCODONE  (ROXICODONE ) 5 MG immediate release tablet Take 1 tablet (5 mg total) by mouth every 6 (six) hours as needed. 06/11/23   Pappayliou, Dorothyann A, DO    Allergies: Vancomycin    Review of Systems  Neurological:  Positive for seizures.    Updated Vital Signs BP (!) 150/88 (BP Location: Right Arm)   Pulse 95   Temp 98.4 F (36.9 C)   Resp 18   Ht 5' 9 (1.753 m)   Wt 111.1 kg   SpO2 100%   BMI 36.17 kg/m   Physical Exam Vitals and nursing note reviewed.  Constitutional:      General: He is not in acute distress.    Appearance: He is well-developed. He is not diaphoretic.  HENT:     Head: Normocephalic and atraumatic.  Eyes:     General: No scleral icterus.    Conjunctiva/sclera: Conjunctivae normal.  Cardiovascular:     Rate and Rhythm: Normal rate and regular rhythm.     Heart sounds: Normal heart sounds.  Pulmonary:     Effort: Pulmonary effort is normal. No respiratory distress.     Breath sounds: Normal breath sounds.  Abdominal:     Palpations: Abdomen is soft.     Tenderness: There is no abdominal tenderness.  Musculoskeletal:  Cervical back: Normal range of motion and neck supple.  Skin:    General: Skin is warm and dry.  Neurological:     General: No focal deficit present.     Mental Status: He is alert and oriented to person, place, and time.     Cranial Nerves: No cranial nerve deficit.     Sensory: No sensory deficit.     Motor: No weakness.     Coordination: Coordination normal.     Gait: Gait normal.     Deep Tendon Reflexes: Reflexes normal.  Psychiatric:        Behavior: Behavior normal.     (all labs ordered are listed, but only abnormal results are displayed) Labs Reviewed  CBC - Abnormal; Notable for the following components:      Result Value   WBC 14.3 (*)    All other components within normal limits  URINALYSIS, ROUTINE  W REFLEX MICROSCOPIC  COMPREHENSIVE METABOLIC PANEL WITH GFR  LEVETIRACETAM  LEVEL  MAGNESIUM     EKG: None  Radiology: No results found.   Procedures   Medications Ordered in the ED - No data to display                                  Medical Decision Making Amount and/or Complexity of Data Reviewed Labs: ordered. ECG/medicine tests: ordered.  Risk Prescription drug management.   This patient presents to the ED for concern of seizure, this involves an extensive number of treatment options, and is a complaint that carries with it a high risk of complications and morbidity.  The differential diagnosis for includes but is not limited to idiopathic seizure, traumatic brain injury, intracranial hemorrhage, vascular lesion, mass or space containing lesion, degenerative neurologic disease, congenital brain abnormality, infectious etiology such as meningitis, encephalitis or abscess, metabolic disturbance including hyper or hypoglycemia, hyper or hyponatremia, hyperosmolar state, uremia, hepatic failure, hypocalcemia, hypomagnesemia.  Toxic substances such as cocaine, lidocaine , antidepressants, theophylline, alcohol withdrawal, drug withdrawal, eclampsia, hypertensive encephalopathy and anoxic brain injury.   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   Social Determinants of Health:   SDOH Screenings   Food Insecurity: No Food Insecurity (05/27/2023)  Housing: Low Risk  (05/27/2023)  Transportation Needs: No Transportation Needs (05/27/2023)  Utilities: Not At Risk (05/27/2023)  Social Connections: Unknown (03/10/2022)   Received from [facility]  Tobacco Use: Low Risk  (04/05/2024)  Health Literacy: Medium Risk (02/28/2021)   Received from [facility]     Additional history:  {Additional history obtained from nursing triage {External records from outside source obtained and reviewed including previous outpatient neurology notes  Lab Tests:  I Ordered, and  personally interpreted labs.  The pertinent results include:   No acute lab findings    Cardiac Monitoring/ECG:  The patient was maintained on a cardiac monitor.  I personally viewed and interpreted the cardiac monitored which showed an underlying rhythm of:  EKG shows sinus rhythm at a rate of 90 with normal intervals  Medicines ordered and prescription drug management:  I ordered medication including  Medications  lacosamide (VIMPAT) tablet 100 mg (100 mg Oral Given 04/05/24 2046)  lacosamide (VIMPAT) 300 mg in sodium chloride  0.9 % 25 mL IVPB (300 mg Intravenous New Bag/Given 04/05/24 2051)   for seizures Reevaluation of the patient after these medicines showed that the patient improved I have reviewed the patients home medicines and have made adjustments  as needed  Test Considered:   I considered imaging however neurology did not recommend any at this time  Critical Interventions:    Consultations Obtained: Consult with Dr. Sallyann who has written a note and consultation on the patient.  Patient PR interval normal was given IV Vimpat loading dose and will be discharged with the same medication  Problem List / ED Course:     ICD-10-CM   1. Seizure (HCC)  R56.9 Ambulatory referral to Neurology      MDM: 34 year old male who presents emergency department with seizure he has not been on his medication for a very long time now.  He has been having seizure for the past month.  Has some history of difficulty tolerating Keppra  was seen earlier today at [facility] was sent out on high-dose Keppra .  Patient returned today asking if he could be treated with a different medication he continues to have seizures today and has had several.  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  I Had an Extensive Conversation with the patient regarding riding a motorcycle which he has been doing lately.  He understands the risks and was strongly encouraged not to do any activities  that could put his life or the lives of others at risk.  Patient given ambulatory referral to outpatient neurology.  He was seen in shared visit with our neurohospitalist.  He appears otherwise appropriate for discharge after Vimpat loading dose.   Dispostion:  After consideration of the diagnostic results and the patients response to treatment, I feel that the patent would benefit from discharge with outpatient neurology follow-up.      Final diagnoses:  None    ED Discharge Orders          Ordered    Lacosamide 100 MG TABS  2 times daily PRN        04/05/24 2137    Ambulatory referral to Neurology       Comments: An appointment is requested in approximately: 4 weeks   04/05/24 2140               Arloa Chroman, PA-C 04/05/24 2146    Dean Clarity, MD 04/05/24 2326

## 2024-04-07 LAB — LEVETIRACETAM LEVEL: Levetiracetam Lvl: 14.3 ug/mL (ref 10.0–40.0)

## 2024-04-16 ENCOUNTER — Encounter: Payer: Self-pay | Admitting: Neurology

## 2024-04-17 ENCOUNTER — Other Ambulatory Visit: Payer: Self-pay | Admitting: Neurology

## 2024-04-17 DIAGNOSIS — G40909 Epilepsy, unspecified, not intractable, without status epilepticus: Secondary | ICD-10-CM

## 2024-04-17 MED ORDER — LACOSAMIDE 150 MG PO TABS: 150.0000 mg | ORAL_TABLET | Freq: Two times a day (BID) | ORAL | 4 refills | Status: DC

## 2024-04-17 NOTE — Telephone Encounter (Signed)
 Phone rep called pt to schedule EEG, vm left asking pt to call office.

## 2024-04-29 ENCOUNTER — Ambulatory Visit: Admitting: Neurology

## 2024-04-29 ENCOUNTER — Other Ambulatory Visit: Payer: Self-pay | Admitting: Neurology

## 2024-04-29 ENCOUNTER — Ambulatory Visit: Payer: Self-pay | Admitting: Neurology

## 2024-04-29 DIAGNOSIS — G40909 Epilepsy, unspecified, not intractable, without status epilepticus: Secondary | ICD-10-CM

## 2024-04-29 MED ORDER — LACOSAMIDE 150 MG PO TABS: 150.0000 mg | ORAL_TABLET | Freq: Two times a day (BID) | ORAL | 4 refills | Status: AC

## 2024-04-29 NOTE — Telephone Encounter (Signed)
 Pt called stating that  Md was suppose to up dosage  of medication . Pt states Pharmacy informed Pt  that a new order need to be sent over  for Pt medication changes  Lacosamide 150 MG TABS   Pharmacy   [id number] - [place], [place] - [address] (Ph: [phone])  Order Details

## 2024-04-29 NOTE — Telephone Encounter (Signed)
 Pt here for EEG this AM and says he will be out of meds tonight. They were given to him by ER, he would like Dr. Gregg to refill if possible. He uses [facility]. Please call him to let him know if you can refill. He cannot remember exactly what drug name is

## 2024-04-29 NOTE — Addendum Note (Signed)
 Addended by: ONEITA NEVELYN BRAVO on: 04/29/2024 02:16 PM   Modules accepted: Orders

## 2024-04-29 NOTE — Telephone Encounter (Signed)
 Called pt and he stated that he uses [facility] pharmacy not [facility] in [place]. Sending to MD to refill.

## 2024-04-29 NOTE — Telephone Encounter (Signed)
 Requested Prescriptions   Pending Prescriptions Disp Refills   Lacosamide 150 MG TABS 60 tablet 4    Sig: Take 1 tablet (150 mg total) by mouth 2 (two) times daily.   Last seen 06/19/22 Next appt 05/06/24  Dispenses   Dispensed Days Supply Quantity Provider Pharmacy  LACOSAMIDE   TAB 150MG  04/20/2024 30 60 tablet Camara, Amadou, MD [phone]  lacosamide 100 mg tablet 04/06/2024 30 60 each Arloa Chroman, PA-C [facility] [place], .[place]

## 2024-04-29 NOTE — Addendum Note (Signed)
 Addended by: IZELL HOUSEHOLDER R on: 04/29/2024 02:18 PM   Modules accepted: Orders

## 2024-04-29 NOTE — Procedures (Signed)
    History:  33 year old man with seizure   EEG classification: Awake and drowsy  Duration: 27 minutes   Technical aspects: This EEG study was done with scalp electrodes positioned according to the 10-20 International system of electrode placement. Electrical activity was reviewed with band pass filter of 1-70Hz , sensitivity of 7 uV/mm, display speed of 30mm/sec with a 60Hz  notched filter applied as appropriate. EEG data were recorded continuously and digitally stored.   Description of the recording: The background rhythms of this recording consists of a fairly well modulated medium amplitude alpha rhythm of 10 Hz that is reactive to eye opening and closure. Present in the anterior head region is a 15-20 Hz beta activity. Photic stimulation was performed, did not show any abnormalities. Hyperventilation was also performed, did not show any abnormalities. Drowsiness was manifested by background fragmentation. No abnormal epileptiform discharges seen during this recording. There was no focal slowing. There were no electrographic seizure identified.   Abnormality: None   Impression: This is a normal awake and drowsy EEG. No evidence of interictal epileptiform discharges. Normal EEGs, however, do not rule out epilepsy.    Jermanie Minshall, MD [facility]

## 2024-04-30 NOTE — Telephone Encounter (Signed)
 See other encounter. Dr. Gregg sent results via mychart.

## 2024-05-06 ENCOUNTER — Ambulatory Visit: Payer: Self-pay | Admitting: Neurology

## 2024-05-06 ENCOUNTER — Encounter: Payer: Self-pay | Admitting: Neurology

## 2024-05-06 VITALS — BP 130/82 | HR 84 | Ht 69.0 in | Wt 225.0 lb

## 2024-05-06 DIAGNOSIS — G40909 Epilepsy, unspecified, not intractable, without status epilepticus: Secondary | ICD-10-CM | POA: Diagnosis not present

## 2024-05-06 MED ORDER — TRAZODONE HCL 50 MG PO TABS: 50.0000 mg | ORAL_TABLET | Freq: Every day | ORAL | 0 refills | Status: AC
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
## 2024-05-06 NOTE — Patient Instructions (Addendum)
 Continue with lacosamide 150 mg twice daily Will refer you to the EMU for capture and characterization Follow-Up As Scheduled in January after the EMU Admission

## 2024-05-07 ENCOUNTER — Telehealth: Payer: Self-pay | Admitting: Neurology

## 2024-05-07 NOTE — Telephone Encounter (Signed)
 Referral for neurology fax to [facility]. Phone: [phone], Fax: [phone]

## 2024-05-25 ENCOUNTER — Inpatient Hospital Stay (HOSPITAL_COMMUNITY): Admission: RE | Admit: 2024-05-25 | Source: Ambulatory Visit | Admitting: Neurology

## 2024-05-26 ENCOUNTER — Other Ambulatory Visit: Payer: Self-pay

## 2024-06-02 ENCOUNTER — Encounter: Payer: Self-pay | Admitting: Neurology

## 2024-06-10 ENCOUNTER — Encounter: Payer: Self-pay | Admitting: Neurology

## 2024-06-15 ENCOUNTER — Other Ambulatory Visit: Payer: Self-pay

## 2024-06-15 ENCOUNTER — Inpatient Hospital Stay (HOSPITAL_COMMUNITY)
Admission: RE | Admit: 2024-06-15 | Discharge: 2024-06-18 | DRG: 880 | Disposition: A | Discharge status: Home / Self Care | Attending: Neurology | Admitting: Neurology

## 2024-06-15 ENCOUNTER — Encounter (HOSPITAL_COMMUNITY): Payer: Self-pay | Admitting: Neurology

## 2024-06-15 ENCOUNTER — Inpatient Hospital Stay (HOSPITAL_COMMUNITY)

## 2024-06-15 DIAGNOSIS — Z8249 Family history of ischemic heart disease and other diseases of the circulatory system: Secondary | ICD-10-CM

## 2024-06-15 DIAGNOSIS — F32A Depression, unspecified: Secondary | ICD-10-CM | POA: Diagnosis present

## 2024-06-15 DIAGNOSIS — R569 Unspecified convulsions: Principal | ICD-10-CM

## 2024-06-15 DIAGNOSIS — G40909 Epilepsy, unspecified, not intractable, without status epilepticus: Secondary | ICD-10-CM | POA: Diagnosis present

## 2024-06-15 DIAGNOSIS — Z820 Family history of epilepsy and other diseases of the nervous system: Secondary | ICD-10-CM

## 2024-06-15 DIAGNOSIS — Z881 Allergy status to other antibiotic agents status: Secondary | ICD-10-CM | POA: Diagnosis not present

## 2024-06-15 DIAGNOSIS — I10 Essential (primary) hypertension: Secondary | ICD-10-CM | POA: Diagnosis present

## 2024-06-15 DIAGNOSIS — Z79899 Other long term (current) drug therapy: Secondary | ICD-10-CM

## 2024-06-15 DIAGNOSIS — F445 Conversion disorder with seizures or convulsions: Principal | ICD-10-CM | POA: Diagnosis present

## 2024-06-15 LAB — CBC WITH DIFFERENTIAL/PLATELET
Abs Immature Granulocytes: 0.26 K/uL — ABNORMAL HIGH (ref 0.00–0.07)
Basophils Absolute: 0.2 K/uL — ABNORMAL HIGH (ref 0.0–0.1)
Basophils Relative: 1 %
Eosinophils Absolute: 0.5 K/uL (ref 0.0–0.5)
Eosinophils Relative: 5 %
HCT: 43.2 % (ref 39.0–52.0)
Hemoglobin: 14.8 g/dL (ref 13.0–17.0)
Immature Granulocytes: 2 %
Lymphocytes Relative: 32 %
Lymphs Abs: 3.6 K/uL (ref 0.7–4.0)
MCH: 28 pg (ref 26.0–34.0)
MCHC: 34.3 g/dL (ref 30.0–36.0)
MCV: 81.8 fL (ref 80.0–100.0)
Monocytes Absolute: 0.6 K/uL (ref 0.1–1.0)
Monocytes Relative: 5 %
Neutro Abs: 6 K/uL (ref 1.7–7.7)
Neutrophils Relative %: 55 %
Platelets: 285 K/uL (ref 150–400)
RBC: 5.28 MIL/uL (ref 4.22–5.81)
RDW: 12.5 % (ref 11.5–15.5)
WBC: 11.1 K/uL — ABNORMAL HIGH (ref 4.0–10.5)
nRBC: 0 % (ref 0.0–0.2)

## 2024-06-15 LAB — COMPREHENSIVE METABOLIC PANEL WITH GFR
ALT: 36 U/L (ref 0–44)
AST: 27 U/L (ref 15–41)
Albumin: 3.6 g/dL (ref 3.5–5.0)
Alkaline Phosphatase: 72 U/L (ref 38–126)
Anion gap: 9 (ref 5–15)
BUN: 15 mg/dL (ref 6–20)
CO2: 27 mmol/L (ref 22–32)
Calcium: 8.8 mg/dL — ABNORMAL LOW (ref 8.9–10.3)
Chloride: 102 mmol/L (ref 98–111)
Creatinine, Ser: 0.92 mg/dL (ref 0.61–1.24)
GFR, Estimated: >60 mL/min (ref >60)
Glucose, Bld: 115 mg/dL — ABNORMAL HIGH (ref 70–99)
Potassium: 4 mmol/L (ref 3.5–5.1)
Sodium: 138 mmol/L (ref 135–145)
Total Bilirubin: 0.6 mg/dL (ref 0.0–1.2)
Total Protein: 6.7 g/dL (ref 6.5–8.1)

## 2024-06-15 LAB — PROTIME-INR
INR: 1 (ref 0.8–1.2)
Prothrombin Time: 13.8 s (ref 11.4–15.2)

## 2024-06-15 LAB — RAPID URINE DRUG SCREEN, HOSP PERFORMED
Amphetamines: NOT DETECTED
Barbiturates: NOT DETECTED
Benzodiazepines: NOT DETECTED
Cocaine: NOT DETECTED
Opiates: NOT DETECTED
Tetrahydrocannabinol: NOT DETECTED

## 2024-06-15 LAB — HIV ANTIBODY (ROUTINE TESTING W REFLEX): HIV Screen 4th Generation wRfx: NONREACTIVE

## 2024-06-15 LAB — MAGNESIUM: Magnesium: 1.8 mg/dL (ref 1.7–2.4)

## 2024-06-15 LAB — PHOSPHORUS: Phosphorus: 4.3 mg/dL (ref 2.5–4.6)

## 2024-06-15 MED ORDER — QUETIAPINE FUMARATE 100 MG PO TABS
200.0000 mg | ORAL_TABLET | Freq: Every day | ORAL | Status: DC
  Administered 2024-06-15 – 2024-06-17 (×3): 200 mg via ORAL
  Filled 2024-06-15 (×3): qty 2

## 2024-06-15 MED ORDER — MIDAZOLAM HCL (PF) 2 MG/2ML IJ SOLN: 2.0000 mg | INTRAMUSCULAR | Status: DC | PRN

## 2024-06-15 MED ORDER — SERTRALINE HCL 100 MG PO TABS
100.0000 mg | ORAL_TABLET | Freq: Every day | ORAL | Status: DC
  Administered 2024-06-15 – 2024-06-18 (×4): 100 mg via ORAL
  Filled 2024-06-15 (×4): qty 1

## 2024-06-15 MED ORDER — ACETAMINOPHEN 650 MG RE SUPP: 650.0000 mg | RECTAL | Status: DC | PRN

## 2024-06-15 MED ORDER — LABETALOL HCL 5 MG/ML IV SOLN: 5.0000 mg | INTRAVENOUS | Status: DC | PRN

## 2024-06-15 MED ORDER — ACETAMINOPHEN 325 MG PO TABS: 650.0000 mg | ORAL_TABLET | ORAL | Status: DC | PRN

## 2024-06-15 MED ORDER — ENOXAPARIN SODIUM 40 MG/0.4ML IJ SOSY
40.0000 mg | PREFILLED_SYRINGE | INTRAMUSCULAR | Status: DC
  Filled 2024-06-15: qty 0.4

## 2024-06-15 MED ORDER — NEBIVOLOL HCL 10 MG PO TABS
10.0000 mg | ORAL_TABLET | Freq: Every day | ORAL | Status: DC
  Administered 2024-06-15 – 2024-06-18 (×4): 10 mg via ORAL
  Filled 2024-06-15 (×4): qty 1

## 2024-06-15 NOTE — Plan of Care (Signed)
°  Problem: Education: Goal: Knowledge of General Education information will improve Description: Including pain rating scale, medication(s)/side effects and non-pharmacologic comfort measures Outcome: Progressing   Problem: Clinical Measurements: Goal: Ability to maintain clinical measurements within normal limits will improve Outcome: Progressing   Problem: Education: Goal: Expressions of having a comfortable level of knowledge regarding the disease process will increase Outcome: Progressing
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
## 2024-06-15 NOTE — Progress Notes (Signed)
 LTM EEG hooked up and running - no initial skin breakdown - push button tested - [facility] monitoring.

## 2024-06-16 NOTE — Progress Notes (Signed)
 Photic and HV completed. Impedances under 10, no skin breakdown noted.

## 2024-06-16 NOTE — TOC CM/SW Note (Signed)
 Transition of Care [facility]) - Inpatient Brief Assessment   Patient Details  Name: Tim Tucker MRN: [id number] Date of Birth: [birth year]  Transition of Care [facility]) CM/SW Contact:    Andrez JULIANNA George, RN Phone Number: 06/16/2024, 11:14 AM   Clinical Narrative:  Pt is admitted to EMU. No current IP Care management needs. If needs arise please place consult.   Transition of Care Asessment: Insurance and Status: Insurance coverage has been reviewed Patient has primary care physician: Yes Home environment has been reviewed: home with spouse   Prior/Current Home Services: No current home services Social Drivers of Health Review: SDOH reviewed no interventions necessary Readmission risk has been reviewed: Yes Transition of care needs: no transition of care needs at this time

## 2024-06-16 NOTE — Progress Notes (Signed)
 Subjective: Had 1 episode earlier this morning which she states is typical of what happens at home.  I discussed that this episode was nonepileptic.  ROS: negative except above  Examination  Vital signs in last 24 hours: Temp:  [97.6 F (36.4 C)-99.7 F (37.6 C)] 99.7 F (37.6 C) (12/16 1123) Pulse Rate:  [66-93] 73 (12/16 1123) Resp:  [12-18] 18 (12/16 1123) BP: (110-136)/(65-84) 119/68 (12/16 1123) SpO2:  [96 %-99 %] 99 % (12/16 1123) Weight:  [113.4 kg] 113.4 kg (12/15 1947)  General: Appears well-developed and well-nourished.  Psych: Affect appropriate to situation Neuro: AO x 3, cranial nerves grossly intact, 5/5 in all 4 extremities, sensory intact to light touch, FTN intact bilaterally    Basic Metabolic Panel: Recent Labs  Lab 06/15/24 1424  NA 138  K 4.0  CL 102  CO2 27  GLUCOSE 115*  BUN 15  CREATININE 0.92  CALCIUM 8.8*  MG 1.8  PHOS 4.3    CBC: Recent Labs  Lab 06/15/24 1424  WBC 11.1*  NEUTROABS 6.0  HGB 14.8  HCT 43.2  MCV 81.8  PLT 285     Coagulation Studies: Recent Labs    06/15/24 1424  LABPROT 13.8  INR 1.0    Imaging No new brain imaging overnight  ASSESSMENT AND PLAN:33 year old male admitted to epilepsy monitoring unit for characterization of seizure-like episodes.   Seizure-like episodes - Continue video-EEG monitoring for characterization of spells - Hold Vimpat  for seizure provocation - Plan for hyperventilation, photic stimulation and sleep deprivation today -Discussed that overnight episode was nonepileptic. - Continue seizure precautions -As needed IV Versed  for clinical seizures   Hypertension Resume home medications   Depression Continue home medications     I personally spent a total of 37 minutes in the care of the patient today including getting/reviewing separately obtained history, performing a medically appropriate exam/evaluation, counseling and educating, placing orders, referring and communicating  with other health care professionals, documenting clinical information in the EHR, independently interpreting results, and coordinating care.        Arlin Krebs Epilepsy [facility] For questions after 5pm please refer to AMION to reach the Neurologist on call

## 2024-06-16 NOTE — Progress Notes (Signed)
 At 0504, EEG monitoring called to notify this RN that patient pushed button and patient's legs started jerking. This RN was in patient's room after phone call from EEG monitoring, less than 1 minute, around 0505. Upon assessment, patient was back to baseline, answered all questions, denies any symptoms.

## 2024-06-16 NOTE — Progress Notes (Signed)
 Pt refused lovenox  at 1200.  He stated I don't like shots.  I explained the reasoning for the med.  Dr. Shelton notified.  Pt does have SCD's on as ordered.

## 2024-06-16 NOTE — Procedures (Signed)
 Patient Name: Tim Tucker  MRN: [id number]  Epilepsy Attending: Arlin MALVA Krebs  Referring Physician/Provider: Krebs Arlin MALVA, MD  Duration: 06/15/2024 1138 to 06/16/2024 1138  Patient history: 33 year old male admitted to epilepsy monitoring unit for characterization of seizure-like episodes. EEG to evaluate for seizure  Level of alertness: Awake, asleep  AEDs during EEG study: None  Technical aspects: This EEG study was done with scalp electrodes positioned according to the 10-20 International system of electrode placement. Electrical activity was reviewed with band pass filter of 1-70Hz , sensitivity of 7 uV/mm, display speed of 77mm/sec with a 60Hz  notched filter applied as appropriate. EEG data were recorded continuously and digitally stored.  Video monitoring was available and reviewed as appropriate.  Description: The posterior dominant rhythm consists of 8-9 Hz activity of moderate voltage (25-35 uV) seen predominantly in posterior head regions, symmetric and reactive to eye opening and eye closing. Sleep was characterized by vertex waves, sleep spindles (12 to 14 Hz), maximal frontocentral   One event was recorded on 06/16/2024 at  0503. Patient had sudden asynchronous high amplitude whole body jerking for about  10-11 seconds. Concomitant EEG before and after the event showed normal posterior dominant rhythm and did not show any EEG changes suggest seizure.  Hyperventilation and photic stimulation were not performed.     IMPRESSION: This study is within normal limits. No seizures or epileptiform discharges were seen throughout the recording.  One event was recorded on 06/16/2024 at  0503. Patient had sudden asynchronous high amplitude whole body jerking for about  10-11 seconds. Concomitant EEG did not show any EEG change. This was not an epileptic event.  Ewen Varnell O Sundee Garland

## 2024-06-17 ENCOUNTER — Inpatient Hospital Stay (HOSPITAL_COMMUNITY)

## 2024-06-17 DIAGNOSIS — R569 Unspecified convulsions: Secondary | ICD-10-CM | POA: Diagnosis not present

## 2024-06-17 NOTE — Progress Notes (Signed)
 LTM maint complete - no skin breakdown under: Fp1 Fp2 Serviced F8 and scg leads [facility] monitored

## 2024-06-17 NOTE — Procedures (Signed)
 Patient Name: Tim Tucker  MRN: [id number]  Epilepsy Attending: Arlin MALVA Krebs  Referring Physician/Provider: Krebs Arlin MALVA, MD  Duration: 06/16/2024 1138 to 06/17/2024 1138   Patient history: 33 year old male admitted to epilepsy monitoring unit for characterization of seizure-like episodes. EEG to evaluate for seizure   Level of alertness: Awake, asleep   AEDs during EEG study: None   Technical aspects: This EEG study was done with scalp electrodes positioned according to the 10-20 International system of electrode placement. Electrical activity was reviewed with band pass filter of 1-70Hz , sensitivity of 7 uV/mm, display speed of 68mm/sec with a 60Hz  notched filter applied as appropriate. EEG data were recorded continuously and digitally stored.  Video monitoring was available and reviewed as appropriate.   Description: The posterior dominant rhythm consists of 8-9 Hz activity of moderate voltage (25-35 uV) seen predominantly in posterior head regions, symmetric and reactive to eye opening and eye closing. Sleep was characterized by vertex waves, sleep spindles (12 to 14 Hz), maximal frontocentral    Event button was pressed on 06/16/2024 at 1830. Patient reported staring off few minutes ago. Concomitant EEG before and after the event showed normal posterior dominant rhythm and did not show any EEG changes suggest seizure.   Photic driving was not seen during photic stimulation. No eeg change was seen during hyperventilation.  IMPRESSION: This study is within normal limits. No seizures or epileptiform discharges were seen throughout the recording.   Event button was pressed on 06/16/2024 at 1830. Patient reported staring off few minutes ago. Concomitant EEG did not show any EEG change. This was not an epileptic event.   Shalanda Brogden O Ranada Vigorito

## 2024-06-17 NOTE — Progress Notes (Addendum)
 Subjective:  Had an episode of staring yesterday evening.  No other episodes.  ROS: negative except above  Examination  Vital signs in last 24 hours: Temp:  [98 F (36.7 C)-98.5 F (36.9 C)] 98.4 F (36.9 C) (12/17 1158) Pulse Rate:  [66-85] 70 (12/17 1158) Resp:  [16-18] 18 (12/17 1158) BP: (104-131)/(61-81) 119/61 (12/17 1158) SpO2:  [94 %-98 %] 98 % (12/17 1158)  General: Appears well-developed and well-nourished.  Psych: Affect appropriate to situation Neuro: AO x 3, cranial nerves grossly intact, 5/5 in all 4 extremities, sensory intact to light touch, FTN intact bilaterally  Basic Metabolic Panel: Recent Labs  Lab 06/15/24 1424  NA 138  K 4.0  CL 102  CO2 27  GLUCOSE 115*  BUN 15  CREATININE 0.92  CALCIUM 8.8*  MG 1.8  PHOS 4.3    CBC: Recent Labs  Lab 06/15/24 1424  WBC 11.1*  NEUTROABS 6.0  HGB 14.8  HCT 43.2  MCV 81.8  PLT 285     Coagulation Studies: Recent Labs    06/15/24 1424  LABPROT 13.8  INR 1.0    Imaging No new brain imaging overnight   ASSESSMENT AND PLAN:33 year old male admitted to epilepsy monitoring unit for characterization of seizure-like episodes.   Seizure-like episodes - Continue video-EEG monitoring for characterization of spells -Discussed that staring episode yesterday was nonepileptic as well - Continue seizure precautions -As needed IV Versed  for clinical seizures   Hypertension Resume home medications   Depression Continue home medications     I personally spent a total of 26 minutes in the care of the patient today including getting/reviewing separately obtained history, performing a medically appropriate exam/evaluation, counseling and educating, placing orders, referring and communicating with other health care professionals, documenting clinical information in the EHR, independently interpreting results, and coordinating care.      Arlin Krebs Epilepsy [facility] For questions after  5pm please refer to AMION to reach the Neurologist on call

## 2024-06-18 ENCOUNTER — Inpatient Hospital Stay (HOSPITAL_COMMUNITY)

## 2024-06-18 DIAGNOSIS — R569 Unspecified convulsions: Secondary | ICD-10-CM | POA: Diagnosis not present

## 2024-06-18 NOTE — Procedures (Addendum)
 Patient Name: Tim Tucker  MRN: [id number]  Epilepsy Attending: Arlin MALVA Krebs  Referring Physician/Provider: Krebs Arlin MALVA, MD  Duration: 06/17/2024 1138 to 06/18/2024 1150   Patient history: 33 year old male admitted to epilepsy monitoring unit for characterization of seizure-like episodes. EEG to evaluate for seizure   Level of alertness: Awake, asleep   AEDs during EEG study: None   Technical aspects: This EEG study was done with scalp electrodes positioned according to the 10-20 International system of electrode placement. Electrical activity was reviewed with band pass filter of 1-70Hz , sensitivity of 7 uV/mm, display speed of 62mm/sec with a 60Hz  notched filter applied as appropriate. EEG data were recorded continuously and digitally stored.  Video monitoring was available and reviewed as appropriate.   Description: The posterior dominant rhythm consists of 8-9 Hz activity of moderate voltage (25-35 uV) seen predominantly in posterior head regions, symmetric and reactive to eye opening and eye closing. Sleep was characterized by vertex waves, sleep spindles (12 to 14 Hz), maximal frontocentral    IMPRESSION: This study is within normal limits. No seizures or epileptiform discharges were seen throughout the recording.  Tim Tucker O Tim Tucker

## 2024-06-18 NOTE — Discharge Summary (Signed)
 Physician Discharge Summary  Patient ID: Tim Tucker MRN: [id number] DOB/AGE: 33/01/1991 33 y.Tim.  Admit date: 06/15/2024 Discharge date: 06/18/2024  Admission Diagnoses: Seizure  Discharge Diagnoses: Psychogenic nonepileptic spell  Discharged Condition: stable  Hospital Course: Tim Tucker was admitted to epilepsy monitoring unit between 06/15/2024 to 06/18/2024.  During this time, he underwent continuous video EEG monitoring.  Vimpat  was held.  Hyperventilation, photic stimulation and sleep deprivation were performed.  2 typical events were recorded as described in EMG report below.  EEG was normal during both events as well as interictally.  Diagnosis of nonepileptic spells was discussed with patient.  Recommend follow-up with psychiatry for cognitive behavioral therapy.  Vimpat  was resumed.  Seizure precaution including no driving were discussed.  Consults: None  Significant Diagnostic Studies: EMU  The posterior dominant rhythm consists of 8-9 Hz activity of moderate voltage (25-35 uV) seen predominantly in posterior head regions, symmetric and reactive to eye opening and eye closing. Sleep was characterized by vertex waves, sleep spindles (12 to 14 Hz), maximal frontocentral   One event was recorded on 06/16/2024 at  0503. Patient had sudden asynchronous high amplitude whole body jerking for about  10-11 seconds. Concomitant EEG before and after the event showed normal posterior dominant rhythm and did not show any EEG changes suggest seizure.    Event button was pressed on 06/16/2024 at 1830. Patient reported staring off few minutes ago. Concomitant EEG before and after the event showed normal posterior dominant rhythm and did not show any EEG changes suggest seizure.   Photic driving was not seen during photic stimulation. No eeg change was seen during hyperventilation.   IMPRESSION: This study is within normal limits. No seizures or epileptiform discharges were seen throughout  the recording.  One event was recorded on 06/16/2024 at  0503. Patient had sudden asynchronous high amplitude whole body jerking for about  10-11 seconds. Concomitant EEG did not show any EEG change. This was not an epileptic event.     Event button was pressed on 06/16/2024 at 1830. Patient reported staring off few minutes ago. Concomitant EEG did not show any EEG change. This was not an epileptic event.   Tim Tucker Tim Tucker    Treatments: Continue home dose of Vimpat ,  Discharge Exam: Blood pressure 116/69, pulse 65, temperature 98.5 F (36.9 C), resp. rate 18, height 5' 9 (1.753 m), weight 113.4 kg, SpO2 97%. General: Appears well-developed and well-nourished.  Psych: Affect appropriate to situation Neuro: AO x 3, cranial nerves grossly intact, 5/5 in all 4 extremities, sensory intact to light touch, FTN intact bilaterally   Discharge disposition: 01-Home or Self Care   Discharge Instructions     Call MD for:   Complete by: As directed    If patient has another seizure, call 911 and bring them back to the ED if: A.  The seizure lasts longer than 5 minutes.      B.  The patient doesn't wake shortly after the seizure or has new problems such as difficulty seeing, speaking or moving following the seizure C.  The patient was injured during the seizure D.  The patient has a temperature over 102 F (39C) E.  The patient vomited during the seizure and now is having trouble breathing      Discharge instructions   Complete by: As directed    During the Seizure   - First, ensure adequate ventilation and place patients on the floor on their left side  Loosen clothing around the  neck and ensure the airway is patent. If the patient is clenching the teeth, do not force the mouth open with any object as this can cause severe damage - Remove all items from the surrounding that can be hazardous. The patient may be oblivious to what's happening and may not even know what he or she is doing. If  the patient is confused and wandering, either gently guide him/her away and block access to outside areas - Reassure the individual and be comforting - Call 911. In most cases, the seizure ends before EMS arrives. However, there are cases when seizures may last over 3 to 5 minutes. Or the individual may have developed breathing difficulties or severe injuries. If a pregnant patient or a person with diabetes develops a seizure, it is prudent to call an ambulance.   After the Seizure (Postictal Stage)   After a seizure, most patients experience confusion, fatigue, muscle pain and/or a headache. Thus, one should permit the individual to sleep. For the next few days, reassurance is essential. Being calm and helping reorient the person is also of importance.   Most seizures are painless and end spontaneously. Seizures are not harmful to others but can lead to complications such as stress on the lungs, brain and the heart. Individuals with prior lung problems may develop labored breathing and respiratory distress.        Increase activity slowly   Complete by: As directed    Other Restrictions   Complete by: As directed    Seizure precautions: Per [place] statutes, patients with seizures are not allowed to drive until they have been seizure-free for six months and cleared by a physician    Use caution when using heavy equipment or power tools. Avoid working on ladders or at heights. Take showers instead of baths. Ensure the water temperature is not too high on the home water heater. Do not go swimming alone. Do not lock yourself in a room alone (i.e. bathroom). When caring for infants or small children, sit down when holding, feeding, or changing them to minimize risk of injury to the child in the event you have a seizure. Maintain good sleep hygiene. Avoid alcohol.         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   nebivolol  10 MG tablet Commonly known as: BYSTOLIC  Take 10 mg by mouth daily.   promethazine  25 MG tablet Commonly known as: PHENERGAN  Take 25 mg by mouth every 6 (six) hours as needed for nausea or vomiting.   propranolol 20 MG tablet Commonly known as: INDERAL Take 20 mg by mouth every 8 (eight) hours as needed (severe anxiety).   QUEtiapine  200 MG tablet Commonly known as: SEROQUEL  Take 200 mg by mouth at bedtime.   sertraline  100 MG tablet Commonly known as: ZOLOFT  Take 150 mg by mouth daily.   traZODone  50 MG tablet Commonly known as: DESYREL  Take 1 tablet (50 mg total) by mouth at bedtime. What changed:  when to take this reasons to take this   triamcinolone cream 0.1 % Commonly known as: KENALOG Apply 1 Application topically 2 (two) times daily as needed (skin irritation).  I personally spent a total of 45 minutes in the care of the patient today including getting/reviewing separately obtained history, performing a medically appropriate exam/evaluation, counseling and educating, placing orders, referring and communicating with other health care professionals, documenting clinical information in the EHR, independently interpreting results, and coordinating care.          Signed: Tobi Leinweber Tim Perl Folmar 06/18/2024, 11:39 AM

## 2024-06-18 NOTE — TOC Transition Note (Signed)
 Transition of Care [facility]) - Discharge Note   Patient Details  Name: Tim Tucker MRN: [id number] Date of Birth: 1991/04/30  [facility] CM/SW Contact:  Andrez JULIANNA George, RN Phone Number: 06/18/2024, 11:40 AM   Clinical Narrative:     Pt is discharging home with self care. No needs per IP Care management.   Final next level of care: Home/Self Care Barriers to Discharge: No Barriers Identified   Patient Goals and CMS Choice            Discharge Placement                       Discharge Plan and Services Additional resources added to the After Visit Summary for                                       Social Drivers of Health (SDOH) Interventions SDOH Screenings   Food Insecurity: No Food Insecurity (06/15/2024)  Housing: Low Risk (06/15/2024)  Transportation Needs: No Transportation Needs (06/15/2024)  Utilities: Not At Risk (06/15/2024)  Social Connections: Unknown (03/10/2022)   Received from [facility]  Tobacco Use: Low Risk (06/15/2024)  Recent Concern: Tobacco Use - High Risk (04/01/2024)   Received from [facility]     Readmission Risk Interventions     No data to display

## 2024-06-18 NOTE — Progress Notes (Signed)
 EMU D/C'd. No skin breakdown noted. [facility] notified.

## 2024-06-18 NOTE — Discharge Instructions (Signed)
 You were admitted to epilepsy monitoring unit between 06/15/2024 to 06/18/2024. During this time, you underwent continuous video-EEG monitoring.  Vimpat  was held.  Hyperventilation, photic stimulation and sleep deprivation were performed.  2 events were recorded.  During 1 event, you had whole body jerking.  During second event you are staring off.  EEG during both those events was within normal limits.  In between events, your EEG was also within normal limits.  We discussed the diagnosis of nonepileptic spells.  Your Vimpat  was resumed.  Discussed follow-up with psychiatry for cognitive behavioral therapy for now.  Seizure precautions including no driving were also discussed.

## 2024-06-18 NOTE — Plan of Care (Signed)

## 2024-07-31 ENCOUNTER — Encounter: Payer: Self-pay | Admitting: Neurology

## 2024-07-31 ENCOUNTER — Ambulatory Visit: Admitting: Neurology
